# Patient Record
Sex: Male | Born: 1955
Health system: Southern US, Community
[De-identification: ages and names within clinical notes are randomized; demographics above are authoritative.]

## PROBLEM LIST (undated history)

## (undated) DIAGNOSIS — M1990 Unspecified osteoarthritis, unspecified site: Secondary | ICD-10-CM

## (undated) DIAGNOSIS — E785 Hyperlipidemia, unspecified: Secondary | ICD-10-CM

## (undated) DIAGNOSIS — I2510 Atherosclerotic heart disease of native coronary artery without angina pectoris: Secondary | ICD-10-CM

## (undated) DIAGNOSIS — F429 Obsessive-compulsive disorder, unspecified: Secondary | ICD-10-CM

## (undated) DIAGNOSIS — I10 Essential (primary) hypertension: Secondary | ICD-10-CM

## (undated) HISTORY — PX: TONSILLECTOMY: SUR1361

## (undated) HISTORY — PX: CARDIAC CATHETERIZATION: SHX172

## (undated) HISTORY — PX: ARTHROSCOPIC REPAIR ACL: SUR80

## (undated) HISTORY — DX: Atherosclerotic heart disease of native coronary artery without angina pectoris: I25.10

## (undated) HISTORY — DX: Obsessive-compulsive disorder, unspecified: F42.9

## (undated) HISTORY — PX: OTHER SURGICAL HISTORY: SHX169

## (undated) HISTORY — PX: EYE SURGERY: SHX253

## (undated) HISTORY — PX: HERNIA REPAIR: SHX51

## (undated) HISTORY — DX: Hyperlipidemia, unspecified: E78.5

## (undated) HISTORY — DX: Essential (primary) hypertension: I10

---

## 2002-09-30 ENCOUNTER — Ambulatory Visit (HOSPITAL_BASED_OUTPATIENT_CLINIC_OR_DEPARTMENT_OTHER): Admission: RE | Admit: 2002-09-30 | Discharge: 2002-09-30 | Payer: Self-pay | Admitting: *Deleted

## 2004-04-14 ENCOUNTER — Emergency Department (HOSPITAL_COMMUNITY): Admission: EM | Admit: 2004-04-14 | Discharge: 2004-04-15 | Payer: Self-pay | Admitting: Emergency Medicine

## 2005-07-10 ENCOUNTER — Ambulatory Visit (HOSPITAL_COMMUNITY): Admission: RE | Admit: 2005-07-10 | Discharge: 2005-07-10 | Payer: Self-pay | Admitting: Gastroenterology

## 2005-07-10 ENCOUNTER — Encounter (INDEPENDENT_AMBULATORY_CARE_PROVIDER_SITE_OTHER): Payer: Self-pay | Admitting: *Deleted

## 2007-12-05 ENCOUNTER — Ambulatory Visit (HOSPITAL_COMMUNITY): Admission: RE | Admit: 2007-12-05 | Discharge: 2007-12-05 | Payer: Self-pay | Admitting: Cardiology

## 2007-12-19 ENCOUNTER — Inpatient Hospital Stay (HOSPITAL_COMMUNITY): Admission: AD | Admit: 2007-12-19 | Discharge: 2007-12-20 | Payer: Self-pay | Admitting: *Deleted

## 2008-01-01 ENCOUNTER — Encounter: Admission: RE | Admit: 2008-01-01 | Discharge: 2008-01-01 | Payer: Self-pay | Admitting: *Deleted

## 2008-02-06 ENCOUNTER — Encounter (HOSPITAL_COMMUNITY): Admission: RE | Admit: 2008-02-06 | Discharge: 2008-05-06 | Payer: Self-pay | Admitting: *Deleted

## 2008-05-07 ENCOUNTER — Encounter (HOSPITAL_COMMUNITY): Admission: RE | Admit: 2008-05-07 | Discharge: 2008-06-01 | Payer: Self-pay | Admitting: *Deleted

## 2008-10-19 ENCOUNTER — Encounter: Admission: RE | Admit: 2008-10-19 | Discharge: 2008-10-19 | Payer: Self-pay | Admitting: Family Medicine

## 2008-11-27 ENCOUNTER — Ambulatory Visit (HOSPITAL_BASED_OUTPATIENT_CLINIC_OR_DEPARTMENT_OTHER): Admission: RE | Admit: 2008-11-27 | Discharge: 2008-11-27 | Payer: Self-pay | Admitting: Specialist

## 2010-06-02 ENCOUNTER — Ambulatory Visit: Payer: Self-pay | Admitting: Cardiology

## 2010-06-24 ENCOUNTER — Ambulatory Visit: Payer: Self-pay | Admitting: Cardiology

## 2010-09-12 LAB — POCT I-STAT 4, (NA,K, GLUC, HGB,HCT)
Glucose, Bld: 100 mg/dL — ABNORMAL HIGH (ref 70–99)
HCT: 47 % (ref 39.0–52.0)

## 2010-10-18 NOTE — Discharge Summary (Signed)
NAME:  Nathan Sampson, PAPADOPOULOS NO.:  [id number]   MEDICAL RECORD NO.:  [id number]          PATIENT TYPE:  INP   LOCATION:  6527                         FACILITY:  [facility]   PHYSICIAN:  Elmore Guise., M.D.DATE OF BIRTH:  May 07, 1956   DATE OF ADMISSION:  12/19/2007  DATE OF DISCHARGE:  12/20/2007                               DISCHARGE SUMMARY   DISCHARGE DIAGNOSES:  1. History of obstructive two-vessel coronary artery disease, status      post percutaneous coronary intervention of his mid left anterior      descending and distal right coronary artery.  2. Dyslipidemia.  3. Borderline hypertension.  4. Attention deficit disorder.   HISTORY OF PRESENT ILLNESS:  Nathan Sampson is a 55 year old white male who  presented for evaluation of some like atypical chest pain.  He underwent  stress test at an outside facility which showed a reversible  anteroseptal and anterior defect.  He continued to have off and on  symptoms despite being put on Toprol, aspirin with sublingual  nitroglycerin.  He was then referred for cardiac catheterization.  However, the patient wanted noninvasive study first.  Therefore, he was  referred for coronary CT.  This showed moderate obstruction in his mid  LAD with a moderate obstruction in his distal RCA.  He was then referred  for cath.   HOSPITAL COURSE:  The patient underwent cardiac catheterization on December 19, 2007, tolerated procedure well.  He was found to have two-vessel  disease with 60%-70% disease in his mid LAD as well as in his distal  RCA.  He underwent successful PCI with 2 Promus drug-eluting stents.  In  his postop course, he did have a small hematoma.  He did have Angio-Seal  device failure noted.  This resolved with direct compression.  He is now  being up and ambulatory with no significant problems.  His hemoglobin  has been stable.  Postprocedure hemoglobin of 14.2 and postprocedure day  #1 hemoglobin of 13.6.  Kidney function  showed a BUN and creatinine of  13 and 1.1 with potassium level of 4.3.  His EKG showed nonspecific  changes.  He will be discharged home to continue the following  medications:  1. Lipitor 40 mg daily.  2. Aciphex 20 mg daily.  3. Strattera 240 mg daily.  4. Advair  5. Toprol 50 mg daily.  6. Aspirin 325 mg daily.  7. Sublingual nitroglycerin as needed.  8. Plavix 75 mg daily.   Because of his hematoma, I have recommended no significant exertional  activities such as heavy lifting, prolonged walking or exercise for the  next 5 days.  He may then slowly resume his normal activities as  tolerated.  He is to call the office if he has any further problems,  otherwise, I plan to see him in the office in 2 weeks.  He may resume  back his normal work activities on Monday, December 30, 2007.      Elmore Guise., M.D.  Electronically Signed     TWK/MEDQ  D:  12/20/2007  T:  12/20/2007  Job:  [id number]

## 2010-10-18 NOTE — Op Note (Signed)
NAME:  Nathan Sampson, Nathan Sampson                 ACCOUNT NO.:  [id number]   MEDICAL RECORD NO.:  [id number]          PATIENT TYPE:  AMB   LOCATION:  [facility]                         FACILITY:  [facility]   PHYSICIAN:  Jene Every, M.D.    DATE OF BIRTH:  [birth year]   DATE OF PROCEDURE:  11/27/2008  DATE OF DISCHARGE:                               OPERATIVE REPORT   PREOPERATIVE DIAGNOSIS:  Medial meniscus tear, degenerative joint  disease, right knee.   POSTOPERATIVE DIAGNOSIS:  Medial meniscus tear, degenerative joint  disease, right knee.   PROCEDURES PERFORMED:  1. Right knee arthroscopy.  2. Partial medial meniscectomy.  3. Chondroplasty of the medial femoral condyle, tibial plateau,      patella.   BRIEF HISTORY:  A 55 year old, meniscus tear, refractory to conservative  treatment, confirmed by MRI.  Tender medial joint line, positive  McMurray, indicated for partial medial meniscectomy and debridement.   The risks and benefits discussed, including bleeding, infection, damage  to vascular structures, no change in symptoms, worsening symptoms, need  for repeat debridement, anesthetic complications, etc.   TECHNIQUE:  Patient in supine position.  After induction of adequate  anesthesia, 1 gram of Kefzol, the right lower extremity was prepped and  draped in the usual sterile fashion.  A lateral parapatellar portal and  superomedial parapatellar portal was fashioned with a #11 blade.  Ingress cannula atraumatically placed.  Irrigant was utilized to  insufflate the joint.  Under direct visualization, medial parapatellar  portal was fashioned with a #11 blade after localization with an 18-  gauge needle, sparing the medial meniscus.  There was a tear of the  posterior horn of the medial meniscus, complex.  Straight basket rongeur  was introduced and utilized to perform partial medial meniscectomy to a  stable base.  The inner third of the posterior third was removed.  Extensive cartilaginous  debris was noted and extensive grade 3 change of  the medial femoral condyle was noted.  A 3.5 Cuda shaver was introduced  and utilized to perform a light chondroplasty of the medial femoral  condyle and evacuate loose bodies.   ACL and PCL were unremarkable.  Mild fraying was noted.   Lateral compartment revealed essentially normal femoral condyle, tibial  plateau and meniscus, stable to probe palpation.  There was a loose  cartilaginous body floating in the medial compartment.  This was  evacuated.   Suprapatellar pouch:  Grade 3 changes to the patella, normal  patellofemoral tracking.  Chondroplasty was performed here as well.   Gutters were unremarkable.   Reexamined the medial compartment.  No further pathology amenable to  arthroscopic intervention.  The knee copiously lavaged, all  instrumentation was removed.  Portals were closed with 4-0 nylon simple  sutures and 0.25%  Marcaine with epinephrine was infiltrate in the joint, and the wound was  dressed sterilely.  Awoken without difficulty and transported to the  recovery room in satisfactory condition.   The patient tolerated the procedure well, no complications.      Jene Every, M.D.  Electronically Signed     JB/MEDQ  D:  11/27/2008  T:  11/27/2008  Job:  [id number]

## 2010-10-21 NOTE — Op Note (Signed)
NAME:  Nathan Sampson, Nathan Sampson                 ACCOUNT NO.:  [id number]   MEDICAL RECORD NO.:  [id number]          PATIENT TYPE:  AMB   LOCATION:  ENDO                         FACILITY:  [facility]   PHYSICIAN:  Anselmo Rod, M.D.  DATE OF BIRTH:  04-24-1956   DATE OF PROCEDURE:  07/10/2005  DATE OF DISCHARGE:                                 OPERATIVE REPORT   PROCEDURE PERFORMED:  Colonoscopy with snare polypectomy times one.   ENDOSCOPIST:  Jyothi. Loreta Ave, M.D.   INSTRUMENT USED:  Olympus video colonoscope.   INDICATIONS FOR PROCEDURE:  A 55 year old white male with a history of  rectal bleeding undergoing screening colonoscopy.  Rule out colonic polyps,  masses, etc.   PREPROCEDURE PREPARATION:  Informed consent was procured from the patient.  The patient was fasted for four hours prior to the procedure after being  prepped with OsmoPrep pills the night prior to and the morning of the  procedure.  The risks and benefits of the procedure including a 10% miss  rate for polyps or cancers was discussed with the patient as well.   PREPROCEDURE PHYSICAL:  The patient had stable vital signs.  Neck supple.  Chest clear to auscultation.  S1 and S2 regular.  Abdomen soft with normal  bowel sounds.   DESCRIPTION OF PROCEDURE:  The patient was placed in left lateral decubitus  position and sedated with 100 mcg of fentanyl and 10 mg of Versed in slow  incremental doses.  Once the patient was adequately sedated and maintained  on low flow oxygen and continuous cardiac monitoring, the Olympus video  colonoscope was advanced from the rectum to the cecum. The appendicular  orifice and ileocecal valve were clearly visualized and photographed. A  large pedunculated polyp was snared from 25 cm with a cut and coag  technique.  There was evidence of sigmoid diverticulosis.  The transverse  colon, right colon and cecum appeared normal.  The patient tolerated the  procedure well without complication.  Small  internal hemorrhoids were seen  on retroflexion.   IMPRESSION:  1.  Small nonbleeding internal hemorrhoids.  2.  Large pedunculated polyp removed from 25 cm by hot snare.  3.  Early sigmoid diverticulosis.  4.  Normal-appearing transverse colon, right colon and cecum.   RECOMMENDATIONS:  1.  Avoid all nonsteroidals including aspirin for the next four weeks.  2.  Outpatient followup as need arises in the future.  3.  Repeat colonoscopy depending on pathology results.  4.  A high fiber diet with liberal fluid intake has been encouraged.      Further recommendations will be made at follow-up.      Anselmo Rod, M.D.  Electronically Signed     JNM/MEDQ  D:  07/10/2005  T:  07/10/2005  Job:  [id number]   cc:   Teena Irani. Arlyce Dice, M.D.  Fax: [phone]

## 2010-11-22 ENCOUNTER — Other Ambulatory Visit: Payer: Self-pay | Admitting: *Deleted

## 2010-11-22 MED ORDER — RAMIPRIL 5 MG PO TABS: 5.0000 mg | ORAL_TABLET | Freq: Every day | ORAL | Status: DC

## 2010-11-22 MED ORDER — FOLIC ACID 1 MG PO TABS: 1.0000 mg | ORAL_TABLET | Freq: Every day | ORAL | Status: DC

## 2010-11-24 ENCOUNTER — Other Ambulatory Visit: Payer: Self-pay | Admitting: Cardiology

## 2010-11-24 MED ORDER — RAMIPRIL 5 MG PO TABS: 5.0000 mg | ORAL_TABLET | Freq: Every day | ORAL | Status: DC

## 2010-11-24 NOTE — Telephone Encounter (Signed)
CALL PATIENT REGARDING REFILLS (HE BELIEVES ALL MEDS) AND WHETHER OR NOT HE NEEDS ANY LABS TO CHECK CHOLESTEROL.

## 2010-11-24 NOTE — Telephone Encounter (Signed)
Called requesting refill on Ramipril. Sent to [facility]. Also made him an app. Was suppose to be seen  in June and didn't get recall.

## 2010-12-19 ENCOUNTER — Other Ambulatory Visit: Payer: Self-pay | Admitting: Cardiology

## 2010-12-19 MED ORDER — CARVEDILOL 12.5 MG PO TABS: 12.5000 mg | ORAL_TABLET | Freq: Two times a day (BID) | ORAL | Status: DC

## 2010-12-19 NOTE — Telephone Encounter (Signed)
Pt wanted refill of coreg please call

## 2010-12-19 NOTE — Telephone Encounter (Signed)
Called requesting refill on Carvedilol. Sent to [facility] in [place]

## 2010-12-27 ENCOUNTER — Encounter: Payer: Self-pay | Admitting: Cardiology

## 2010-12-28 ENCOUNTER — Encounter: Payer: Self-pay | Admitting: Cardiology

## 2010-12-28 ENCOUNTER — Ambulatory Visit (INDEPENDENT_AMBULATORY_CARE_PROVIDER_SITE_OTHER): Payer: BC Managed Care – PPO | Admitting: Cardiology

## 2010-12-28 VITALS — BP 132/80 | HR 60 | Ht 70.0 in | Wt 227.0 lb

## 2010-12-28 DIAGNOSIS — E785 Hyperlipidemia, unspecified: Secondary | ICD-10-CM | POA: Insufficient documentation

## 2010-12-28 DIAGNOSIS — I2510 Atherosclerotic heart disease of native coronary artery without angina pectoris: Secondary | ICD-10-CM

## 2010-12-28 DIAGNOSIS — I10 Essential (primary) hypertension: Secondary | ICD-10-CM

## 2010-12-28 NOTE — Patient Instructions (Signed)
I recommend you increase your Lipitor to 80 mg daily.  You may stop Plavix and continue ASA 325 mg daily.  Get regular aerobic exercise and lose weight.  I will see you again in 6 months.

## 2010-12-28 NOTE — Assessment & Plan Note (Signed)
He remains asymptomatic from a coronary standpoint. His last Cardiolite study in June of 2011 was normal. I think that he can safely stop his Plavix at this point and remain on aspirin daily. He needs to increase his aerobic activity and lose weight.

## 2010-12-28 NOTE — Progress Notes (Signed)
Nathan Sampson Date of Birth: 1955-10-29   History of Present Illness: Nathan Sampson is seen for followup today. He continues to complain of feeling tired and exhausted. He complains that his cognitive function has decreased. He's had severe erectile dysfunction for at least 7 months. At times he feels so tired he can't get out of bed. He has not been exercising regularly. He is considering starting on testosterone supplementation. He reports his blood pressure is usually low in the morning and then sometimes spikes in the afternoon or evening. He denies any chest pain or shortness of breath.  Current Outpatient Prescriptions on File Prior to Visit  Medication Sig Dispense Refill  . ALPRAZolam (XANAX) 0.5 MG tablet Take 0.5 mg by mouth at bedtime as needed.        Marland Kitchen aspirin 325 MG tablet Take 325 mg by mouth daily.        Marland Kitchen atomoxetine (STRATTERA) 80 MG capsule Take 80 mg by mouth daily.        Marland Kitchen atorvastatin (LIPITOR) 40 MG tablet Take 40 mg by mouth daily.        . carvedilol (COREG) 12.5 MG tablet Take 1 tablet (12.5 mg total) by mouth 2 (two) times daily.  60 tablet  5  . clopidogrel (PLAVIX) 75 MG tablet Take 75 mg by mouth daily.        . Coenzyme Q10 (EQL COQ10) 300 MG CAPS Take by mouth daily.        . fish oil-omega-3 fatty acids 1000 MG capsule Take 500 mg by mouth daily.        Marland Kitchen NIACIN PO Take 1,000 mg by mouth daily.        . nitroGLYCERIN (NITROSTAT) 0.4 MG SL tablet Place 0.4 mg under the tongue every 5 (five) minutes as needed.        . ramipril (ALTACE) 5 MG tablet Take 1 tablet (5 mg total) by mouth daily.  90 tablet  3    Allergies  Allergen Reactions  . Penicillins     Past Medical History  Diagnosis Date  . Coronary artery disease     Status post stenting of the mid LAD and distal right coronary with drug-eluting stents in July 2009.  He is Asymptomatic. He had a strss echo in September 2009 which was normal.  . Dyslipidemia   . Hypertension     Poorly Controlled.  Exacerbated by sedentary lifestyle, diet and weight gain, and stress.  . OCD (obsessive compulsive disorder)     Past Surgical History  Procedure Date  . Arthroscopic repair acl   . Cardiovascular stress test 11-29-2009    EF 57%    History  Smoking status  . Former Smoker  . Quit date: 06/05/2008  Smokeless tobacco  . Not on file    History  Alcohol Use     Family History  Problem Relation Age of Onset  . Diabetes Father     Review of Systems: As noted in history of present illness All other systems were reviewed and are negative.  Physical Exam: BP 132/80  Pulse 60  Ht 5\' 10"  (1.778 m)  Wt 227 lb (102.967 kg)  BMI 32.57 kg/m2 He is an obese white male in no acute distress. He is normocephalic, atraumatic. Pupils are round and reactive to light accommodation. Extraocular movements are full. Oropharynx is clear. Neck is supple no JVD, adenopathy, thyromegaly, or bruits. Lungs are clear. Cardiac exam reveals a regular rate and rhythm without  gallop, murmur, or click. Abdomen is soft and nontender. He has no edema. Pedal pulses are good. LABORATORY DATA: Dated December 13, 2010 his total cholesterol is 192, triglycerides 146, HDL 37, LDL 126, and non-HDL cholesterol 155. Chemistries and CBC are normal. ECG today is normal.  Assessment / Plan:

## 2010-12-28 NOTE — Assessment & Plan Note (Signed)
He is not at goal of an LDL less than or equal to 70 or non-HDL cholesterol less than or equal to 100. Recommended increasing his Lipitor to 80 mg per day. He were made on his niacin. We discussed appropriate diet and weight loss.

## 2010-12-28 NOTE — Assessment & Plan Note (Signed)
We will continue with his current antihypertensive therapy. Headache his blood pressure control will improve with better aerobic conditioning and weight loss.

## 2010-12-29 ENCOUNTER — Encounter: Payer: Self-pay | Admitting: Cardiology

## 2011-02-16 ENCOUNTER — Telehealth: Payer: Self-pay | Admitting: Cardiology

## 2011-02-16 NOTE — Telephone Encounter (Signed)
Pt states he was put on testerone and pt states he has dropped 25 lbs.  Pt states his blood pressure has dropped as well.  Pt states he checked it today and it was 100 / 60.  Please call pt back for medical advice.  Chart in box.

## 2011-02-17 NOTE — Telephone Encounter (Signed)
Had called yesterday to say he has started Testerone shots after his last visit here. Has lost about 25 lbs.from 225 lb to now 208 lbs. BP has dropped also. BP now 108/66 hr; 60; 90/60; states he feels fine, no lightness or dizziness. Taking Coreg 12.5 mg BID, Ramipril 5 mg daily. Per Dr. Swaziland will decrease Coreg to 6.25 mg BID. LM for him to decrease dose and to call if has questions.

## 2011-03-03 LAB — BASIC METABOLIC PANEL
CO2: 29
Chloride: 106
GFR calc Af Amer: >60
Sodium: 139

## 2011-03-03 LAB — CBC
HCT: 39
Hemoglobin: 13.6
MCHC: 34.8
MCV: 87.4
RBC: 4.47
RBC: 4.65
WBC: 7.7

## 2011-07-07 ENCOUNTER — Other Ambulatory Visit: Payer: Self-pay | Admitting: Cardiology

## 2011-08-23 ENCOUNTER — Ambulatory Visit: Payer: BC Managed Care – PPO | Admitting: Cardiology

## 2011-09-15 ENCOUNTER — Other Ambulatory Visit: Payer: Self-pay | Admitting: Otolaryngology

## 2011-09-21 ENCOUNTER — Encounter: Payer: Self-pay | Admitting: Cardiology

## 2011-09-21 ENCOUNTER — Ambulatory Visit (INDEPENDENT_AMBULATORY_CARE_PROVIDER_SITE_OTHER): Payer: BC Managed Care – PPO | Admitting: Cardiology

## 2011-09-21 VITALS — BP 122/76 | HR 58 | Ht 70.0 in | Wt 236.0 lb

## 2011-09-21 DIAGNOSIS — I10 Essential (primary) hypertension: Secondary | ICD-10-CM

## 2011-09-21 DIAGNOSIS — E785 Hyperlipidemia, unspecified: Secondary | ICD-10-CM

## 2011-09-21 DIAGNOSIS — I2510 Atherosclerotic heart disease of native coronary artery without angina pectoris: Secondary | ICD-10-CM

## 2011-09-21 MED ORDER — CARVEDILOL 12.5 MG PO TABS: 12.5000 mg | ORAL_TABLET | Freq: Two times a day (BID) | ORAL | Status: DC

## 2011-09-21 MED ORDER — RAMIPRIL 5 MG PO TABS: 5.0000 mg | ORAL_TABLET | Freq: Every day | ORAL | Status: DC

## 2011-09-21 MED ORDER — NITROGLYCERIN 0.4 MG SL SUBL: 0.4000 mg | SUBLINGUAL_TABLET | SUBLINGUAL | Status: DC | PRN

## 2011-09-21 NOTE — Progress Notes (Signed)
Nathan Sampson Date of Birth: January 13, 1956   History of Present Illness: Nathan Sampson is seen for followup today. He states he is doing very well. He reports that he started on testosterone therapy. He felt much better and was working out regularly. He lost down to 200 pounds. He then injured his rotator cuff and has since been inactive and has gained back another 36 pounds. He is scheduled for surgery on his rotator cuff on May 9. He denies any significant chest pain or shortness of breath. He's had no edema or palpitations.  Current Outpatient Prescriptions on File Prior to Visit  Medication Sig Dispense Refill  . ALPRAZolam (XANAX) 0.5 MG tablet Take 0.5 mg by mouth at bedtime as needed.        Marland Kitchen aspirin 325 MG tablet Take 325 mg by mouth daily.        Marland Kitchen atomoxetine (STRATTERA) 80 MG capsule Take 80 mg by mouth daily.        Marland Kitchen atorvastatin (LIPITOR) 40 MG tablet Take 40 mg by mouth daily.        . clopidogrel (PLAVIX) 75 MG tablet Take 75 mg by mouth daily.        . Coenzyme Q10 (EQL COQ10) 300 MG CAPS Take by mouth daily.        . fish oil-omega-3 fatty acids 1000 MG capsule Take 500 mg by mouth daily.        Marland Kitchen NIACIN PO Take 1,000 mg by mouth daily.        Marland Kitchen DISCONTD: carvedilol (COREG) 12.5 MG tablet TAKE ONE TABLET BY MOUTH TWICE DAILY  60 tablet  5  . DISCONTD: nitroGLYCERIN (NITROSTAT) 0.4 MG SL tablet Place 0.4 mg under the tongue every 5 (five) minutes as needed.        Marland Kitchen DISCONTD: ramipril (ALTACE) 5 MG tablet Take 1 tablet (5 mg total) by mouth daily.  90 tablet  3    Allergies  Allergen Reactions  . Penicillins     Past Medical History  Diagnosis Date  . Coronary artery disease     Status post stenting of the mid LAD and distal right coronary with drug-eluting stents in July 2009.  He is Asymptomatic. He had a strss echo in September 2009 which was normal.  . Dyslipidemia   . Hypertension     Poorly Controlled. Exacerbated by sedentary lifestyle, diet and weight gain, and  stress.  . OCD (obsessive compulsive disorder)     Past Surgical History  Procedure Date  . Arthroscopic repair acl   . Cardiovascular stress test 11-29-2009    EF 57%    History  Smoking status  . Former Smoker  . Quit date: 06/05/2008  Smokeless tobacco  . Not on file    History  Alcohol Use     Family History  Problem Relation Age of Onset  . Diabetes Father     Review of Systems: As noted in history of present illness All other systems were reviewed and are negative.  Physical Exam: BP 122/76  Pulse 58  Ht 5\' 10"  (1.778 m)  Wt 236 lb (107.049 kg)  BMI 33.86 kg/m2  SpO2 97% He is an obese white male in no acute distress. He is normocephalic, atraumatic. Pupils are round and reactive to light accommodation. Extraocular movements are full. Oropharynx is clear. Neck is supple no JVD, adenopathy, thyromegaly, or bruits. Lungs are clear. Cardiac exam reveals a regular rate and rhythm without gallop, murmur, or click.  Abdomen is soft and nontender. He has no edema. Pedal pulses are good. LABORATORY DATA:   Assessment / Plan:

## 2011-09-21 NOTE — Assessment & Plan Note (Addendum)
He is status post stenting of the mid LAD and distal right coronary with drug-eluting stents in July of 2009. He is no longer on Plavix. He will continue on aspirin. I would prefer that he stay on aspirin through his surgery to decrease his risk of subacute thrombosis. He is cleared for his rotator cuff surgery from a cardiac standpoint. His last nuclear stress test in June of 2011 was normal.

## 2011-09-21 NOTE — Assessment & Plan Note (Signed)
Blood pressure control is excellent on carvedilol and Altace. These prescriptions were refilled today.

## 2011-09-21 NOTE — Assessment & Plan Note (Signed)
We reviewed his lab work from last summer which indicated that his LDL and non-HDL cholesterol were not at goal. He states that they have been checked a couple months ago were much better. We will get a copy of these lab work. He is no longer on niacin. We will continue with statin therapy.

## 2011-09-21 NOTE — Patient Instructions (Signed)
Continue your current medication.  We will clear you for rotator cuff surgery.  I will see you again in 6 months.  We will get a copy of your lab work from Dr. Andrey Campanile.

## 2011-10-03 ENCOUNTER — Encounter (HOSPITAL_COMMUNITY): Payer: Self-pay | Admitting: Pharmacy Technician

## 2011-10-09 ENCOUNTER — Ambulatory Visit (HOSPITAL_COMMUNITY)
Admission: RE | Admit: 2011-10-09 | Discharge: 2011-10-09 | Disposition: A | Discharge status: Home / Self Care | Payer: BC Managed Care – PPO | Source: Ambulatory Visit | Attending: Specialist | Admitting: Specialist

## 2011-10-09 ENCOUNTER — Encounter (HOSPITAL_COMMUNITY)
Admission: RE | Admit: 2011-10-09 | Discharge: 2011-10-09 | Disposition: A | Discharge status: Home / Self Care | Payer: BC Managed Care – PPO | Source: Ambulatory Visit | Attending: Specialist | Admitting: Specialist

## 2011-10-09 ENCOUNTER — Encounter (HOSPITAL_COMMUNITY): Payer: Self-pay

## 2011-10-09 DIAGNOSIS — I10 Essential (primary) hypertension: Secondary | ICD-10-CM | POA: Insufficient documentation

## 2011-10-09 DIAGNOSIS — Z01812 Encounter for preprocedural laboratory examination: Secondary | ICD-10-CM | POA: Insufficient documentation

## 2011-10-09 DIAGNOSIS — Z01818 Encounter for other preprocedural examination: Secondary | ICD-10-CM | POA: Insufficient documentation

## 2011-10-09 DIAGNOSIS — I2510 Atherosclerotic heart disease of native coronary artery without angina pectoris: Secondary | ICD-10-CM | POA: Insufficient documentation

## 2011-10-09 LAB — BASIC METABOLIC PANEL
GFR calc Af Amer: 77 mL/min — ABNORMAL LOW (ref >90)
GFR calc non Af Amer: 67 mL/min — ABNORMAL LOW (ref >90)
Glucose, Bld: 97 mg/dL (ref 70–99)
Potassium: 4.2 mEq/L (ref 3.5–5.1)
Sodium: 139 mEq/L (ref 135–145)

## 2011-10-09 LAB — SURGICAL PCR SCREEN
MRSA, PCR: INVALID — AB
Staphylococcus aureus: INVALID — AB

## 2011-10-09 LAB — CBC
Hemoglobin: 15.9 g/dL (ref 13.0–17.0)
MCHC: 33.7 g/dL (ref 30.0–36.0)
RDW: 13 % (ref 11.5–15.5)
WBC: 9.3 10*3/uL (ref 4.0–10.5)

## 2011-10-09 NOTE — Patient Instructions (Signed)
YOUR SURGERY IS SCHEDULED ON: Thursday 5/9 AT 9:30 AM  REPORT TO [facility] AT: 7:30 AM      PHONE # FOR SHORT STAY IS [phone]  DO NOT EAT OR DRINK ANYTHING AFTER MIDNIGHT THE NIGHT BEFORE YOUR SURGERY.  YOU MAY BRUSH YOUR TEETH, RINSE OUT YOUR MOUTH--BUT NO WATER, NO FOOD, NO CHEWING GUM, NO MINTS, NO CANDIES, NO CHEWING TOBACCO.  PLEASE TAKE THE FOLLOWING MEDICATIONS THE AM OF YOUR SURGERY WITH A FEW SIPS OF WATER: CARVEDILOL (COREG)    IF YOU USE INHALERS--USE YOUR INHALERS THE AM OF YOUR SURGERY AND BRING INHALERS TO THE HOSPITAL -TAKE TO SURGERY.    IF YOU ARE DIABETIC:  DO NOT TAKE ANY DIABETIC MEDICATIONS THE AM OF YOUR SURGERY.  IF YOU TAKE INSULIN IN THE EVENINGS--PLEASE ONLY TAKE 1/2 NORMAL EVENING DOSE THE NIGHT BEFORE YOUR SURGERY.  NO INSULIN THE AM OF YOUR SURGERY.  IF YOU HAVE SLEEP APNEA AND USE CPAP OR BIPAP--PLEASE BRING THE MASK --NOT THE MACHINE-NOT THE TUBING   -JUST THE MASK. DO NOT BRING VALUABLES, MONEY, CREDIT CARDS.  CONTACT LENS, DENTURES / PARTIALS, GLASSES SHOULD NOT BE WORN TO SURGERY AND IN MOST CASES-HEARING AIDS WILL NEED TO BE REMOVED.  BRING YOUR GLASSES CASE, ANY EQUIPMENT NEEDED FOR YOUR CONTACT LENS. FOR PATIENTS ADMITTED TO THE HOSPITAL--CHECK OUT TIME THE DAY OF DISCHARGE IS 11:00 AM.  ALL INPATIENT ROOMS ARE PRIVATE - WITH BATHROOM, TELEPHONE, TELEVISION AND WIFI INTERNET. IF YOU ARE BEING DISCHARGED THE SAME DAY OF YOUR SURGERY--YOU CAN NOT DRIVE YOURSELF HOME--AND SHOULD NOT GO HOME ALONE BY TAXI OR BUS.  NO DRIVING OR OPERATING MACHINERY FOR 24 HOURS FOLLOWING ANESTHESIA / PAIN MEDICATIONS.                            SPECIAL INSTRUCTIONS:  CHLORHEXIDINE SOAP SHOWER (other brand names are Betasept and Hibiclens ) PLEASE SHOWER WITH CHLORHEXIDINE THE NIGHT BEFORE YOUR SURGERY AND THE AM OF YOUR SURGERY. DO NOT USE CHLORHEXIDINE ON YOUR FACE OR PRIVATE AREAS--YOU MAY USE YOUR NORMAL SOAP THOSE AREAS AND YOUR NORMAL SHAMPOO.    WOMEN SHOULD AVOID SHAVING UNDER ARMS AND SHAVING LEGS 48 HOURS BEFORE USING CHLORHEXIDINE TO AVOID SKIN IRRITATION.  DO NOT USE IF ALLERGIC TO CHLORHEXIDINE.  PLEASE READ OVER ANY  FACT SHEETS THAT YOU WERE GIVEN: MRSA INFORMATION,  INCENTIVE SPIROMETER INFORMATION.

## 2011-10-11 ENCOUNTER — Inpatient Hospital Stay (HOSPITAL_COMMUNITY): Admission: RE | Admit: 2011-10-11 | Payer: BC Managed Care – PPO | Source: Ambulatory Visit

## 2011-10-11 NOTE — Pre-Procedure Instructions (Signed)
CBC, BMET, CXR AND PCR WERE DONE AT [facility] 10/09/11 PREOP--PT HAD EKG REPORT FROM [facility] DONE 12/28/10--REPORT ON PT'S CHART AND IN EPIC AND OFFICE NOTES FROM DR. P. Swaziland 09/21/11 ON PT'S CHART AND IN EPIC.  PCR WAS INVALID-SENT OUT FOR NASAL CULTURE -RESULTS NOT IN EPIC AS OF THIS AM--CHART SENT TO SHORT STAY WITH NOTE TO LOOK FOR RESULTS-SURGERY IS TOMORROW 10/12/11

## 2011-10-12 ENCOUNTER — Encounter (HOSPITAL_COMMUNITY): Payer: Self-pay | Admitting: Anesthesiology

## 2011-10-12 ENCOUNTER — Encounter (HOSPITAL_COMMUNITY): Payer: Self-pay

## 2011-10-12 ENCOUNTER — Ambulatory Visit (HOSPITAL_COMMUNITY)
Admission: RE | Admit: 2011-10-12 | Discharge: 2011-10-12 | Disposition: A | Discharge status: Home / Self Care | Payer: BC Managed Care – PPO | Source: Ambulatory Visit | Attending: Specialist | Admitting: Specialist

## 2011-10-12 ENCOUNTER — Ambulatory Visit (HOSPITAL_COMMUNITY): Payer: BC Managed Care – PPO | Admitting: Anesthesiology

## 2011-10-12 ENCOUNTER — Encounter (HOSPITAL_COMMUNITY)
Admission: RE | Disposition: A | Discharge status: Home / Self Care | Payer: Self-pay | Source: Ambulatory Visit | Attending: Specialist

## 2011-10-12 DIAGNOSIS — X58XXXA Exposure to other specified factors, initial encounter: Secondary | ICD-10-CM | POA: Insufficient documentation

## 2011-10-12 DIAGNOSIS — I10 Essential (primary) hypertension: Secondary | ICD-10-CM | POA: Insufficient documentation

## 2011-10-12 DIAGNOSIS — Z79899 Other long term (current) drug therapy: Secondary | ICD-10-CM | POA: Insufficient documentation

## 2011-10-12 DIAGNOSIS — S43429A Sprain of unspecified rotator cuff capsule, initial encounter: Secondary | ICD-10-CM | POA: Insufficient documentation

## 2011-10-12 DIAGNOSIS — M25819 Other specified joint disorders, unspecified shoulder: Secondary | ICD-10-CM | POA: Insufficient documentation

## 2011-10-12 DIAGNOSIS — M24119 Other articular cartilage disorders, unspecified shoulder: Secondary | ICD-10-CM | POA: Insufficient documentation

## 2011-10-12 DIAGNOSIS — I2510 Atherosclerotic heart disease of native coronary artery without angina pectoris: Secondary | ICD-10-CM | POA: Insufficient documentation

## 2011-10-12 DIAGNOSIS — M75100 Unspecified rotator cuff tear or rupture of unspecified shoulder, not specified as traumatic: Secondary | ICD-10-CM

## 2011-10-12 DIAGNOSIS — E785 Hyperlipidemia, unspecified: Secondary | ICD-10-CM | POA: Insufficient documentation

## 2011-10-12 LAB — MRSA CULTURE

## 2011-10-12 SURGERY — ARTHROSCOPY, SHOULDER, WITH ROTATOR CUFF REPAIR
Anesthesia: General | Site: Shoulder | Laterality: Left | Wound class: Clean

## 2011-10-12 MED ORDER — EPINEPHRINE HCL 1 MG/ML IJ SOLN
INTRAMUSCULAR | Status: AC
  Filled 2011-10-12: qty 2

## 2011-10-12 MED ORDER — ONDANSETRON HCL 4 MG/2ML IJ SOLN: 4.0000 mg | Freq: Four times a day (QID) | INTRAMUSCULAR | Status: DC | PRN

## 2011-10-12 MED ORDER — CISATRACURIUM BESYLATE (PF) 10 MG/5ML IV SOLN
INTRAVENOUS | Status: DC | PRN
  Administered 2011-10-12: 10 mg via INTRAVENOUS

## 2011-10-12 MED ORDER — ETOMIDATE 2 MG/ML IV SOLN
INTRAVENOUS | Status: DC | PRN
  Administered 2011-10-12: 20 mg via INTRAVENOUS

## 2011-10-12 MED ORDER — LACTATED RINGERS IV SOLN
INTRAVENOUS | Status: DC
  Administered 2011-10-12 (×2): 1000 mL via INTRAVENOUS

## 2011-10-12 MED ORDER — HYDROCODONE-ACETAMINOPHEN 7.5-325 MG PO TABS: 1.0000 | ORAL_TABLET | ORAL | Status: DC | PRN

## 2011-10-12 MED ORDER — MIDAZOLAM HCL 5 MG/5ML IJ SOLN
INTRAMUSCULAR | Status: DC | PRN
  Administered 2011-10-12: 2 mg via INTRAVENOUS

## 2011-10-12 MED ORDER — METHOCARBAMOL 500 MG PO TABS: 500.0000 mg | ORAL_TABLET | Freq: Four times a day (QID) | ORAL | Status: DC | PRN

## 2011-10-12 MED ORDER — LACTATED RINGERS IV SOLN: INTRAVENOUS | Status: DC

## 2011-10-12 MED ORDER — SODIUM CHLORIDE 0.9 % IV SOLN: INTRAVENOUS | Status: DC

## 2011-10-12 MED ORDER — CIPROFLOXACIN IN D5W 400 MG/200ML IV SOLN
400.0000 mg | INTRAVENOUS | Status: AC
  Administered 2011-10-12: 400 mg via INTRAVENOUS

## 2011-10-12 MED ORDER — ACETAMINOPHEN 10 MG/ML IV SOLN
INTRAVENOUS | Status: DC | PRN
  Administered 2011-10-12: 1000 mg via INTRAVENOUS

## 2011-10-12 MED ORDER — ACETAMINOPHEN 10 MG/ML IV SOLN
INTRAVENOUS | Status: AC
  Filled 2011-10-12: qty 100

## 2011-10-12 MED ORDER — EPHEDRINE SULFATE 50 MG/ML IJ SOLN
INTRAMUSCULAR | Status: DC | PRN
  Administered 2011-10-12: 5 mg via INTRAVENOUS

## 2011-10-12 MED ORDER — FENTANYL CITRATE 0.05 MG/ML IJ SOLN
INTRAMUSCULAR | Status: DC | PRN
  Administered 2011-10-12: 100 ug via INTRAVENOUS

## 2011-10-12 MED ORDER — EPINEPHRINE HCL 1 MG/ML IJ SOLN
INTRAMUSCULAR | Status: DC | PRN
  Administered 2011-10-12: 2 mL

## 2011-10-12 MED ORDER — LACTATED RINGERS IR SOLN
Status: DC | PRN
  Administered 2011-10-12: 6000 mL

## 2011-10-12 MED ORDER — VANCOMYCIN HCL 1000 MG IV SOLR
1500.0000 mg | INTRAVENOUS | Status: AC
  Administered 2011-10-12: 1500 mg via INTRAVENOUS
  Filled 2011-10-12: qty 1500

## 2011-10-12 MED ORDER — METHOCARBAMOL 100 MG/ML IJ SOLN
500.0000 mg | Freq: Four times a day (QID) | INTRAVENOUS | Status: DC | PRN
  Filled 2011-10-12: qty 5

## 2011-10-12 MED ORDER — HYDROMORPHONE HCL PF 1 MG/ML IJ SOLN: 0.5000 mg | INTRAMUSCULAR | Status: DC | PRN

## 2011-10-12 MED ORDER — MENTHOL 3 MG MT LOZG
1.0000 | LOZENGE | OROMUCOSAL | Status: DC | PRN
  Filled 2011-10-12: qty 9

## 2011-10-12 MED ORDER — HYDROMORPHONE HCL PF 1 MG/ML IJ SOLN: 0.2500 mg | INTRAMUSCULAR | Status: DC | PRN

## 2011-10-12 MED ORDER — SODIUM CHLORIDE 0.9 % IR SOLN
Status: DC | PRN
  Administered 2011-10-12: 10:00:00

## 2011-10-12 MED ORDER — METOCLOPRAMIDE HCL 5 MG PO TABS
5.0000 mg | ORAL_TABLET | Freq: Three times a day (TID) | ORAL | Status: DC | PRN
  Filled 2011-10-12: qty 2

## 2011-10-12 MED ORDER — HYDROCODONE-ACETAMINOPHEN 5-325 MG PO TABS
ORAL_TABLET | ORAL | Status: AC
  Administered 2011-10-12: 2
  Filled 2011-10-12: qty 2

## 2011-10-12 MED ORDER — BUPIVACAINE-EPINEPHRINE (PF) 0.5% -1:200000 IJ SOLN
INTRAMUSCULAR | Status: AC
  Filled 2011-10-12: qty 10

## 2011-10-12 MED ORDER — CHLORHEXIDINE GLUCONATE 4 % EX LIQD
60.0000 mL | Freq: Once | CUTANEOUS | Status: DC
  Filled 2011-10-12: qty 60

## 2011-10-12 MED ORDER — DIPHENHYDRAMINE HCL 12.5 MG/5ML PO ELIX
12.5000 mg | ORAL_SOLUTION | ORAL | Status: DC | PRN
  Filled 2011-10-12: qty 10

## 2011-10-12 MED ORDER — ONDANSETRON HCL 4 MG/2ML IJ SOLN
INTRAMUSCULAR | Status: DC | PRN
  Administered 2011-10-12: 4 mg via INTRAVENOUS

## 2011-10-12 MED ORDER — VANCOMYCIN HCL IN DEXTROSE 1-5 GM/200ML-% IV SOLN
1000.0000 mg | Freq: Two times a day (BID) | INTRAVENOUS | Status: DC
  Filled 2011-10-12: qty 200

## 2011-10-12 MED ORDER — BUPIVACAINE-EPINEPHRINE 0.5% -1:200000 IJ SOLN
INTRAMUSCULAR | Status: DC | PRN
  Administered 2011-10-12: 19 mL

## 2011-10-12 MED ORDER — ONDANSETRON HCL 4 MG PO TABS
4.0000 mg | ORAL_TABLET | Freq: Four times a day (QID) | ORAL | Status: DC | PRN
  Filled 2011-10-12: qty 1

## 2011-10-12 MED ORDER — DOCUSATE SODIUM 100 MG PO CAPS
100.0000 mg | ORAL_CAPSULE | Freq: Two times a day (BID) | ORAL | Status: DC
  Filled 2011-10-12 (×3): qty 1

## 2011-10-12 MED ORDER — METOCLOPRAMIDE HCL 5 MG/ML IJ SOLN: 5.0000 mg | Freq: Three times a day (TID) | INTRAMUSCULAR | Status: DC | PRN

## 2011-10-12 MED ORDER — CIPROFLOXACIN IN D5W 400 MG/200ML IV SOLN
INTRAVENOUS | Status: AC
  Filled 2011-10-12: qty 200

## 2011-10-12 MED ORDER — PHENOL 1.4 % MT LIQD: 1.0000 | OROMUCOSAL | Status: DC | PRN

## 2011-10-12 SURGICAL SUPPLY — 64 items
APL SKNCLS STERI-STRIP NONHPOA (GAUZE/BANDAGES/DRESSINGS) ×1
BAG SPEC THK2 15X12 ZIP CLS (MISCELLANEOUS) ×1
BAG ZIPLOCK 12X15 (MISCELLANEOUS) ×2 IMPLANT
BENZOIN TINCTURE PRP APPL 2/3 (GAUZE/BANDAGES/DRESSINGS) ×2 IMPLANT
BLADE CUTTER GATOR 3.5 (BLADE) ×2 IMPLANT
BLADE OSCILLATING/SAGITTAL (BLADE) ×2
BLADE SURG SZ11 CARB STEEL (BLADE) ×2 IMPLANT
BLADE SW THK.38XMED LNG THN (BLADE) ×1 IMPLANT
BNDG ADH 5X4 AIR PERM ELC (GAUZE/BANDAGES/DRESSINGS) ×1
BNDG COHESIVE 4X5 WHT NS (GAUZE/BANDAGES/DRESSINGS) ×2 IMPLANT
BUR OVAL 4.0 (BURR) ×2 IMPLANT
BUR OVAL CARBIDE 4.0 (BURR) ×2 IMPLANT
CANNULA ACUFO 5X76 (CANNULA) ×3 IMPLANT
CHLORAPREP W/TINT 26ML (MISCELLANEOUS) IMPLANT
CLEANER TIP ELECTROSURG 2X2 (MISCELLANEOUS) ×2 IMPLANT
CLOTH BEACON ORANGE TIMEOUT ST (SAFETY) ×2 IMPLANT
DECANTER SPIKE VIAL GLASS SM (MISCELLANEOUS) ×2 IMPLANT
DRAPE ORTHO SPLIT 77X108 STRL (DRAPES) ×2
DRAPE POUCH INSTRU U-SHP 10X18 (DRAPES) ×2 IMPLANT
DRAPE SURG ORHT 6 SPLT 77X108 (DRAPES) ×1 IMPLANT
DRAPE U-SHAPE 47X51 STRL (DRAPES) ×2 IMPLANT
DRSG EMULSION OIL 3X3 NADH (GAUZE/BANDAGES/DRESSINGS) ×2 IMPLANT
DRSG PAD ABDOMINAL 8X10 ST (GAUZE/BANDAGES/DRESSINGS) ×1 IMPLANT
DURAPREP 26ML APPLICATOR (WOUND CARE) ×2 IMPLANT
ELECT NDL TIP 2.8 STRL (NEEDLE) ×1 IMPLANT
ELECT NEEDLE TIP 2.8 STRL (NEEDLE) ×2 IMPLANT
ELECT REM PT RETURN 9FT ADLT (ELECTROSURGICAL) ×2
ELECTRODE REM PT RTRN 9FT ADLT (ELECTROSURGICAL) ×1 IMPLANT
GLOVE BIO SURGEON STRL SZ 6.5 (GLOVE) ×2 IMPLANT
GLOVE BIOGEL PI IND STRL 8 (GLOVE) ×1 IMPLANT
GLOVE BIOGEL PI INDICATOR 8 (GLOVE) ×1
GLOVE ECLIPSE 6.5 STRL STRAW (GLOVE) ×2 IMPLANT
GLOVE INDICATOR 6.5 STRL GRN (GLOVE) ×2 IMPLANT
GLOVE SURG SS PI 8.0 STRL IVOR (GLOVE) ×4 IMPLANT
GOWN PREVENTION PLUS LG XLONG (DISPOSABLE) ×2 IMPLANT
GOWN PREVENTION PLUS XLARGE (GOWN DISPOSABLE) ×2 IMPLANT
GOWN STRL REIN XL XLG (GOWN DISPOSABLE) ×2 IMPLANT
KIT BASIN OR (CUSTOM PROCEDURE TRAY) ×2 IMPLANT
MANIFOLD NEPTUNE II (INSTRUMENTS) ×4 IMPLANT
NDL SPNL 18GX3.5 QUINCKE PK (NEEDLE) ×1 IMPLANT
NEEDLE SPNL 18GX3.5 QUINCKE PK (NEEDLE) ×2 IMPLANT
PACK SHOULDER CUSTOM OPM052 (CUSTOM PROCEDURE TRAY) ×2 IMPLANT
POSITIONER SURGICAL ARM (MISCELLANEOUS) ×2 IMPLANT
SET ARTHROSCOPY TUBING (MISCELLANEOUS) ×2
SET ARTHROSCOPY TUBING LN (MISCELLANEOUS) ×1 IMPLANT
SLING ARM IMMOBILIZER LRG (SOFTGOODS) ×2 IMPLANT
SLING ARM IMMOBILIZER MED (SOFTGOODS) ×1 IMPLANT
SLING ULTRA II S (ORTHOPEDIC SUPPLIES) ×2 IMPLANT
SPONGE GAUZE 4X4 12PLY (GAUZE/BANDAGES/DRESSINGS) ×1 IMPLANT
SPONGE LAP 4X18 X RAY DECT (DISPOSABLE) ×2 IMPLANT
STRAP CHIN BCCS-OSI (MISCELLANEOUS) ×2 IMPLANT
STRIP CLOSURE SKIN 1/2X4 (GAUZE/BANDAGES/DRESSINGS) ×2 IMPLANT
SUT BONE WAX W31G (SUTURE) ×2 IMPLANT
SUT ETHIBOND 0 (SUTURE) ×3 IMPLANT
SUT ETHIBOND 2 OS 4 DA (SUTURE) ×4 IMPLANT
SUT ETHILON 4 0 PS 2 18 (SUTURE) ×2 IMPLANT
SUT VIC AB 1 CT1 27 (SUTURE)
SUT VIC AB 1 CT1 27XBRD ANTBC (SUTURE) ×2 IMPLANT
SUT VIC AB 1-0 CT2 27 (SUTURE) ×4 IMPLANT
SUT VIC AB 2-0 CT2 27 (SUTURE) ×1 IMPLANT
SUT VICRYL 0 UR6 27IN ABS (SUTURE) ×3 IMPLANT
SUT VICRYL 0-0 OS 2 NEEDLE (SUTURE) ×2 IMPLANT
TAPE CLOTH SURG 6X10 WHT LF (GAUZE/BANDAGES/DRESSINGS) ×1 IMPLANT
TUBING CONNECTING 10 (TUBING) ×2 IMPLANT

## 2011-10-12 NOTE — Anesthesia Postprocedure Evaluation (Signed)
  Anesthesia Post-op Note  [name]  Procedure(s) Performed: Procedure(s) (LRB): SHOULDER ARTHROSCOPY WITH ROTATOR CUFF REPAIR (Left)  Patient Location: PACU  Anesthesia Type: General  Level of Consciousness: awake and alert   Airway and Oxygen Therapy: Patient Spontanous Breathing  Post-op Pain: mild  Post-op Assessment: Post-op Vital signs reviewed, Patient's Cardiovascular Status Stable, Respiratory Function Stable, Patent Airway and No signs of Nausea or vomiting  Post-op Vital Signs: stable  Complications: No apparent anesthesia complications

## 2011-10-12 NOTE — Anesthesia Preprocedure Evaluation (Addendum)
Anesthesia Evaluation  Patient identified by MRN, date of birth, ID band Patient awake    Reviewed: Allergy & Precautions, H&P , NPO status , Patient's Chart, lab work & pertinent test results, reviewed documented beta blocker date and time   Airway Mallampati: II TM Distance: >3 FB Neck ROM: full    Dental No notable dental hx. (+) Teeth Intact and Dental Advisory Given   Pulmonary neg pulmonary ROS,  Sleep apnea surgery breath sounds clear to auscultation  Pulmonary exam normal       Cardiovascular Exercise Tolerance: Good hypertension, Pt. on home beta blockers + CAD and + Cardiac Stents negative cardio ROS  Rhythm:regular Rate:Normal  Stents mid LAD and distal RCA - drug eluting 7/09.  No CP or problems.  Normal nuclear stress 9/09.   Neuro/Psych negative neurological ROS  negative psych ROS   GI/Hepatic negative GI ROS, Neg liver ROS,   Endo/Other  negative endocrine ROS  Renal/GU negative Renal ROS  negative genitourinary   Musculoskeletal   Abdominal   Peds  Hematology negative hematology ROS (+)   Anesthesia Other Findings   Reproductive/Obstetrics negative OB ROS                          Anesthesia Physical Anesthesia Plan  ASA: III  Anesthesia Plan: General   Post-op Pain Management:    Induction: Intravenous  Airway Management Planned: Oral ETT  Additional Equipment:   Intra-op Plan:   Post-operative Plan: Extubation in OR  Informed Consent: I have reviewed the patients History and Physical, chart, labs and discussed the procedure including the risks, benefits and alternatives for the proposed anesthesia with the patient or authorized representative who has indicated his/her understanding and acceptance.   Dental Advisory Given  Plan Discussed with: CRNA and Surgeon  Anesthesia Plan Comments:         Anesthesia Quick Evaluation

## 2011-10-12 NOTE — H&P (Signed)
Nathan Sampson is an 56 y.o. male.   Chief Complaint: left RCT HPI: RCT left shoulder refractory  Past Medical History  Diagnosis Date  . Coronary artery disease     Status post stenting of the mid LAD and distal right coronary with drug-eluting stents in July 2009.  He is Asymptomatic. He had a strss echo in September 2009 which was normal.  . Dyslipidemia   . Hypertension     Poorly Controlled. Exacerbated by sedentary lifestyle, diet and weight gain, and stress.  . OCD (obsessive compulsive disorder)     Past Surgical History  Procedure Date  . Arthroscopic repair acl   . Cardiovascular stress test 11-29-2009    EF 57%  . Hernia repair     AGE 51 - HERNIA REPAIR  . Surgery for sleep apnea     ABOUT 10 YRS AGO-? PALATOPLASTY AND T/A-SURGERY WAS DONE IN [place]    Family History  Problem Relation Age of Onset  . Diabetes Father    Social History:  reports that he quit smoking about 3 years ago. He does not have any smokeless tobacco history on file. His alcohol and drug histories not on file.  Allergies:  Allergies  Allergen Reactions  . Penicillins Anaphylaxis    Medications Prior to Admission  Medication Sig Dispense Refill  . ALPRAZolam (XANAX) 0.5 MG tablet Take 0.5 mg by mouth at bedtime as needed. sleep      . aspirin 325 MG tablet Take 325 mg by mouth at bedtime.       Marland Kitchen atomoxetine (STRATTERA) 80 MG capsule Take 80 mg by mouth daily before breakfast.       . atorvastatin (LIPITOR) 80 MG tablet Take 80 mg by mouth at bedtime.      . carvedilol (COREG) 12.5 MG tablet Take 1 tablet (12.5 mg total) by mouth 2 (two) times daily with a meal.  180 tablet  3  . Coenzyme Q10 (EQL COQ10) 300 MG CAPS Take 300 mg by mouth daily.       . nitroGLYCERIN (NITROSTAT) 0.4 MG SL tablet Place 0.4 mg under the tongue every 5 (five) minutes as needed. Chest pain       . ramipril (ALTACE) 5 MG tablet Take 5 mg by mouth daily before breakfast.      . testosterone cypionate  (DEPOTESTOTERONE CYPIONATE) 100 MG/ML injection Inject 200 mg into the muscle every 14 (fourteen) days. For IM use only        No results found for this or any previous visit (from the past 48 hour(s)). No results found.  Review of Systems  Musculoskeletal: Positive for joint pain.  All other systems reviewed and are negative.    Blood pressure 120/78, pulse 62, temperature 97.2 F (36.2 C), temperature source Oral, resp. rate 18, SpO2 96.00%. Physical Exam  Vitals reviewed. Constitutional: He appears well-developed.  HENT:  Head: Normocephalic.  Eyes: Pupils are equal, round, and reactive to light.  Neck: Normal range of motion.  Cardiovascular: Normal rate.   Respiratory: Effort normal.  GI: Soft.  Musculoskeletal:       Positive pain with ROM left shoulder.  Neurological: He is alert.  Skin: Skin is warm.  Psychiatric: He has a normal mood and affect.     Assessment/Plan Left RCT. Plan RCR arthroscopic assist. . Risks discussed.  Rigel Filsinger C 10/12/2011, 7:31 AM

## 2011-10-12 NOTE — Brief Op Note (Signed)
10/12/2011  10:52 AM  PATIENT:  [name]  56 y.o. male  PRE-OPERATIVE DIAGNOSIS:  Left Shoulder Rotator Cuff Tear  POST-OPERATIVE DIAGNOSIS:  Left Shoulder Rotator Cuff Tear  PROCEDURE:  Procedure(s) (LRB): SHOULDER ARTHROSCOPY WITH ROTATOR CUFF REPAIR (Left)  SURGEON:  Surgeon(s) and Role:    * Javier Docker, MD - Primary  PHYSICIAN ASSISTANT:   ASSISTANTS: strader   ANESTHESIA:   general  EBL:     BLOOD ADMINISTERED:none  DRAINS: none   LOCAL MEDICATIONS USED:  MARCAINE     SPECIMEN:  No Specimen  DISPOSITION OF SPECIMEN:  N/A  COUNTS:  YES  TOURNIQUET:  * No tourniquets in log *  DICTATION: .Other Dictation: Dictation Number [id number]  PLAN OF CARE: Discharge to home after PACU  PATIENT DISPOSITION:  PACU - hemodynamically stable.   Delay start of Pharmacological VTE agent (>24hrs) due to surgical blood loss or risk of bleeding: no

## 2011-10-12 NOTE — Discharge Instructions (Signed)
Shoulder Arthroscopy Because the shoulder is one of the most mobile joints, it is more prone to injury. It is a very shallow ball and socket joint located between the large bone in your upper arm (humerus) and the shoulder blade (scapula). Arthroscopy is a valuable test for evaluating and treating injuries involving the shoulder joint. Arthroscopy is a surgical technique which uses small incisions (cuts by the surgeon) to insert a small telescope like instrument (arthroscope) and other tools into the shoulder. This allows the surgeon to look directly at the problem. When the arthroscope is in the joint, fluid is used to expand the joint space. This allows the surgeon to examine it more easily. The arthroscope then beams light into the joint and sends an image to a TV screen. As your surgeon examines your shoulder, he or she can also repair a number of problems found at the same time. Sometimes the procedure may change to an open surgery. This would happen if the problems are severe enough that they cannot be corrected with just arthroscopy. This is usually a very safe surgery. Rare complications include damage to nerves or blood vessels, excess bleeding, blood clots, infection, and rarely instrument failure. This is most often performed as a same day surgery. This means you will not have to stay in the hospital overnight. Recovery from this surgery is also much faster than having an open procedure. LET YOUR CAREGIVER KNOW ABOUT:  Allergies.   Medications taken including herbs, eye drops, over the counter medications, and creams.   Use of steroids (by mouth or creams).   Previous problems with anesthetics or novocaine.   Possibility of pregnancy, if this applies.   History of blood clots (thrombophlebitis).   History of bleeding or blood problems.   Previous surgery.   Other health problems.   Family history of anesthetic problems.  BEFORE THE PROCEDURE   Stop all anti-inflammatory  medications at least one week before surgery unless instructed otherwise. Tell your surgeon if you have been taking cortisone or other steroids.   Do not eat or drink after midnight or as instructed. Take medications as directed by your caregiver. You may have lab tests the morning of surgery.   You should be present 60 minutes prior to your procedure or as directed.  PROCEDURE  You may have general (go to sleep) or local (numb the area) anesthetic. Your surgeon will discuss this with you. During the procedure as discussed above, your surgeon may find a variety of problems which he or she can improve or correct using small instruments. When the procedure is finished the tiny incisions will be closed with stitches or tape. AFTER YOUR PROCEDURE  After surgery you will be taken to the recovery area. A nurse will watch and check your progress. Once you are awake, stable, and taking fluids well, barring other problems you will be allowed to go home.   Once home, apply an ice pack to your operative site for twenty minutes, three to four times per day, for two to three days. This may help with discomfort and keep the swelling down.   Use a sling and medications if prescribed or as instructed.   Unless your caregiver advises otherwise, move your arm and shoulder gently and frequently following the procedure. This can help prevent stiffness and swelling.  REHABILITATION  Almost as important as your surgery is your rehabilitation. If physical therapy and exercises are prescribed by your surgeon, follow them diligently. Once comfortable and on your way  to full use, do muscle strengthening exercises as instructed.   Only take over-the-counter or prescription medicines for pain, discomfort, or fever as directed by your caregiver.  SEEK IMMEDIATE MEDICAL CARE IF:   There is redness, swelling, or increasing pain in the wound or joint.   You notice purulent (colored- pus-like) drainage coming from the  wound.   An unexplained oral temperature above 102 F (38.9 C) develops.   You notice a foul smell coming from the wound or dressing.   There is a breaking open of the wound. The edges do not stay together after sutures or tape has been removed.   Persistent bleeding from the small incision.  Document Released: 05/19/2000 Document Revised: 05/11/2011 Document Reviewed: 09/07/2008 [facility] Patient Information 2012 [place], [place].

## 2011-10-12 NOTE — Transfer of Care (Signed)
Immediate Anesthesia Transfer of Care Note  [name]  Procedure(s) Performed: Procedure(s) (LRB): SHOULDER ARTHROSCOPY WITH ROTATOR CUFF REPAIR (Left)  Patient Location: PACU  Anesthesia Type: General  Level of Consciousness: awake, alert  and patient cooperative  Airway & Oxygen Therapy: Patient Spontanous Breathing and Patient connected to face mask oxygen  Post-op Assessment: Report given to PACU RN and Post -op Vital signs reviewed and stable  Post vital signs: Reviewed and stable  Complications: No apparent anesthesia complications

## 2011-10-13 NOTE — Discharge Summary (Signed)
Patient ID: Nathan Sampson MRN: [id number] DOB/AGE: 06-11-1955 56 y.o.  Admit date: 10/12/2011 Discharge date: 10/13/2011  Admission Diagnoses:  Left rotator cuff tear Past Medical History  Diagnosis Date  . Coronary artery disease     Status post stenting of the mid LAD and distal right coronary with drug-eluting stents in July 2009.  He is Asymptomatic. He had a strss echo in September 2009 which was normal.  . Dyslipidemia   . Hypertension     Poorly Controlled. Exacerbated by sedentary lifestyle, diet and weight gain, and stress.  . OCD (obsessive compulsive disorder)    Discharge Diagnoses:  Same   Surgeries: Procedure(s): SHOULDER ARTHROSCOPY WITH ROTATOR CUFF REPAIR on 10/12/2011   Consultants:    Discharged Condition: Improved  Hospital Course: Nathan Sampson is an 56 y.o. male who was admitted 10/12/2011 for operative treatment of rotator cuff tear. Patient has severe unremitting pain that affects sleep, daily activities, and work/hobbies. After pre-op clearance the patient was taken to the operating room on 10/12/2011 and underwent  Procedure(s): SHOULDER ARTHROSCOPY WITH ROTATOR CUFF REPAIR.    Patient was given perioperative antibiotics: Anti-infectives     Start     Dose/Rate Route Frequency Ordered Stop   10/12/11 2200   vancomycin (VANCOCIN) IVPB 1000 mg/200 mL premix  Status:  Discontinued        1,000 mg 200 mL/hr over 60 Minutes Intravenous Every 12 hours 10/12/11 1305 10/12/11 1817   10/12/11 1010   polymyxin B 500,000 Units, bacitracin 50,000 Units in sodium chloride irrigation 0.9 % 500 mL irrigation  Status:  Discontinued          As needed 10/12/11 1010 10/12/11 1103   10/12/11 0800   vancomycin (VANCOCIN) 1,500 mg in sodium chloride 0.9 % 500 mL IVPB        1,500 mg 250 mL/hr over 120 Minutes Intravenous To Surgery 10/12/11 0740 10/12/11 1037   10/12/11 0740   ciprofloxacin (CIPRO) IVPB 400 mg        400 mg 200 mL/hr over 60 Minutes Intravenous 120 min  pre-op 10/12/11 0740 10/12/11 4098           Patient was given sequential compression devices, early ambulation, and chemoprophylaxis to prevent DVT.  Patient benefited maximally from hospital stay and there were no complications.    Recent vital signs: Patient Vitals for the past 24 hrs:  BP Temp Pulse Resp SpO2  10/12/11 1245 124/86 mmHg 97.8 F (36.6 C) 58  14  96 %  10/12/11 1230 119/71 mmHg - 54  9  99 %  10/12/11 1215 124/81 mmHg 97.7 F (36.5 C) 58  15  99 %  10/12/11 1200 116/79 mmHg - 59  18  99 %  10/12/11 1145 128/73 mmHg - 60  13  98 %  10/12/11 1130 142/71 mmHg 96.9 F (36.1 C) 63  18  100 %  10/12/11 1115 132/81 mmHg 97.5 F (36.4 C) 74  14  99 %     Recent laboratory studies: No results found for this basename: WBC:2,HGB:2,HCT:2,PLT:2,NA:2,K:2,CL:2,CO2:2,BUN:2,CREATININE:2,GLUCOSE:2,PT:2,INR:2,CALCIUM,2: in the last 72 hours   Discharge Medications:   Medication List  As of 10/13/2011 10:29 AM   TAKE these medications         ALPRAZolam 0.5 MG tablet   Commonly known as: XANAX   Take 0.5 mg by mouth at bedtime as needed. sleep      aspirin 325 MG tablet   Take 325 mg by mouth at bedtime.  atomoxetine 80 MG capsule   Commonly known as: STRATTERA   Take 80 mg by mouth daily before breakfast.      atorvastatin 80 MG tablet   Commonly known as: LIPITOR   Take 80 mg by mouth at bedtime.      carvedilol 12.5 MG tablet   Commonly known as: COREG   Take 1 tablet (12.5 mg total) by mouth 2 (two) times daily with a meal.      EQL COQ10 300 MG Caps   Generic drug: Coenzyme Q10   Take 300 mg by mouth daily.      nitroGLYCERIN 0.4 MG SL tablet   Commonly known as: NITROSTAT   Place 0.4 mg under the tongue every 5 (five) minutes as needed. Chest pain        ramipril 5 MG tablet   Commonly known as: ALTACE   Take 5 mg by mouth daily before breakfast.      testosterone cypionate 100 MG/ML injection   Commonly known as: DEPOTESTOTERONE CYPIONATE    Inject 200 mg into the muscle every 14 (fourteen) days. For IM use only            Diagnostic Studies: Dg Chest 2 View  10/09/2011  *RADIOLOGY REPORT*  Clinical Data: 56 year old male preoperative study for left shoulder surgery.  Hypertension and coronary artery disease.  CHEST - 2 VIEW  Comparison: 06/05/2007.  Findings: Stable lung volumes.  Cardiac size and mediastinal contours are within normal limits.  Visualized tracheal air column is within normal limits.  Mild increased interstitial markings are stable.  No pneumothorax, pulmonary edema, pleural effusion or confluent pulmonary opacity.  Stable wedging of lower thoracic vertebral bodies. No acute osseous abnormality identified.  IMPRESSION: No acute cardiopulmonary abnormality.  Original Report Authenticated By: Harley Hallmark, M.D.    Disposition: 01-Home or Self Care  Discharge Orders    Future Orders Please Complete By Expires   Diet - low sodium heart healthy      Call MD / Call 911      Comments:   If you experience chest pain or shortness of breath, CALL 911 and be transported to the hospital emergency room.  If you develope a fever above 101 F, pus (white drainage) or increased drainage or redness at the wound, or calf pain, call your surgeon's office.   Constipation Prevention      Comments:   Drink plenty of fluids.  Prune juice may be helpful.  You may use a stool softener, such as Colace (over the counter) 100 mg twice a day.  Use MiraLax (over the counter) for constipation as needed.   Increase activity slowly as tolerated      Weight Bearing as taught in Physical Therapy      Comments:   Use a walker or crutches as instructed.   Discharge instructions      Comments:   Use sling at all times except when exercising OK to move hand, wrist, and elbow Keep arm elevated on pillow when out of sling Change dressing daily Ok to shower in 72 hours No lifting No driving 2-4 weeks Pendulum exercises as reviewed with PT        Follow-up Information    Follow up with BEANE,JEFFREY C, MD in 14 days.   Contact information:   [facility] [address], [address] [place] [address] [phone]           Signed: Liam Graham. 10/13/2011, 10:29 AM

## 2011-10-13 NOTE — Op Note (Signed)
NAMETAITEN, BRAWN NO.:  [id number]  MEDICAL RECORD NO.:  [id number]  LOCATION:  [facility]                         FACILITY:  [facility]  PHYSICIAN:  Jene Every, M.D.    DATE OF BIRTH:  01-11-1956  DATE OF PROCEDURE:  10/12/2011 DATE OF DISCHARGE:  10/12/2011                              OPERATIVE REPORT   PREOPERATIVE DIAGNOSES: 1. Extensive labral tear of the left shoulder. 2. Rotator cuff tear. 3. Impingement syndrome.  POSTOP: 1. Extensive labral tear of the left shoulder. 2. Rotator cuff tear. 3. Impingement syndrome.  PROCEDURE PERFORMED: 1. Exam under anesthesia. 2. Left shoulder arthroscopy with debridement of the extent to the     anterior, superior and posterior labral tear, and the glenohumeral     joint. 3. Subacromial decompression, acromioplasty, CA ligament resection,     bursectomy. 4. Mini open rotator cuff repair, side-to-side supraspinatus to     supraspinatus.  ANESTHESIA:  General.  ASSISTANT:  Roma Schanz, P.A.  HISTORY:  This 56 year old male fell on his shoulder 6 months ago, felt a pop.  An MRI indicated a rotator cuff tear, full thickness, supraspinatus, extensive labral tear, weakness in external rotation associated with pain, catching of the shoulder.  He was indicated for arthroscopic debridement.  We removed the labrum and repaired the rotator cuff.  Risks and benefits were discussed including bleeding, infection, damage to vascular structures, no change in symptoms, worsening symptoms, need for repeat debridement, DVT, PE, anesthetic complications etc.  TECHNIQUE:  With the patient in a supine beach-chair position after induction of adequate general anesthesia, 400 mg of Cipro and 1 g of vancomycin due to a history of MRSA exposure, the left shoulder was examined under anesthesia, he had full range.  A surgical marker was utilized to delineate the acromion, AC joint, and coracoid.  A standard posterior lateral  incision was made through the skin only  with a #11 blade.  With the arm in 70-30 position and gentle traction applied, we advanced the arthroscopic cannula into the glenohumeral joint in line with the coracoid penetrating it atraumatically.  Irrigant was then utilized to insufflate the joint.  65 mmHg was utilized.  Noted medially was extensive labral tearing, cartilaginous debris in the glenohumeral joint and some minor grade 4 changes in the glenohumeral joint.  Under direct visualization, an #18-gauge needle was utilized to localize the anterior portal, penetrating the capsule just beneath the biceps tendon between the coracoid and the anterolateral aspect of the acromion.  A small incision was made anteriorly, we advanced the cannula colinear with the needle.  We then introduced a shaver 3-5 into the glenohumeral joint, debriding extensively the anterior, superior and posterior labrum and the glenohumeral debrided.  We inspected the subscap, it was unremarkable.  The biceps tendon other than its labral attachment appeared to be intact and within its groove and subluxating.  There was no full attachment of the labrum, extensive tearing was noted.  This was debrided back to a stable base.  The rotator cuff from beneath had a full-thickness tear in the supraspinatus, more in the midportion of the tendon rather than its insertion.  After copious lavage and  full inspection we then redirected the arthroscopic camera in the subacromial space, translated the anterior portal to the anterolateral portal with a #11 blade, localized the incision, triangulating the subacromial space and insufflating with 65 mmHg.  Noted under direct visualization was hypertrophic bursa, which was excised with a 3-5 shaver performing a full bursectomy and release of the CA ligament.  A full-thickness tear was identified.  We then converted to a mini open rotator cuff repair, removed all arthroscopic equipment and  made a small 2 cm incision over the anterolateral aspect of the acromion between the anterolateral and medial heads of the deltoid.  The subcutaneous tissue was dissected, electrocautery used to achieve hemostasis.  The raphe between the anterolateral heads was identified, infiltrated with 0.25% Marcaine with epinephrine and divided in line with the skin incision.  We used an AO elevator, subperiosteally elevated the anterolateral and anteromedial aspect of this attachment from the acromion preserving the attachment. The remainder of the CA ligament was attached and excised.  The 3 mm Kerrison was utilized to remove the anterolateral aspect of the acromion.  I digitally lysed further adhesions in the sub-bursal space. There was a longitudinal split tear in the supraspinatus extending from near the glenohumeral joint to within a cm of its footprint was noted and some devascularization of the posterior leaflet.  We therefore debrided its edges to good bleeding tissue and then repaired it side-to- side with #1 Ethibond interrupted figure-of-eight sutures with excellent closure.  No further pathology was noted within the rotator cuff.  It had good subacromial space.  Therefore copiously lavaged the wound and repaired the raphe with #1 Vicryl interrupted figure of 8 sutures, subcu with 2-0 Vicryl simple sutures, skin with subcuticular Prolene, the wound reinforced with Steri-Strips.  Sterile dressing was applied. Arthroscopic portals were closed with 4-0 nylon sutures.  The patient was placed on an abduction pillow, extubated without difficulty, and transported to the recovery room in satisfactory condition.  The patient tolerated the procedure well.  There was no complication.     Jene Every, M.D.     Cordelia Pen  D:  10/12/2011  T:  10/13/2011  Job:  [id number]

## 2012-01-31 ENCOUNTER — Telehealth: Payer: Self-pay | Admitting: Cardiology

## 2012-01-31 DIAGNOSIS — I2510 Atherosclerotic heart disease of native coronary artery without angina pectoris: Secondary | ICD-10-CM

## 2012-01-31 NOTE — Telephone Encounter (Signed)
Spoke to patient was told spoke to Dr.Jordan he advised okay to have a stress myoview and follow up with him after.

## 2012-01-31 NOTE — Telephone Encounter (Signed)
New problem:   Wants a strest test before 11/1.

## 2012-03-18 ENCOUNTER — Ambulatory Visit (HOSPITAL_COMMUNITY): Payer: BC Managed Care – PPO | Attending: Cardiology | Admitting: Radiology

## 2012-03-18 VITALS — BP 129/85 | HR 85 | Ht 70.0 in | Wt 225.0 lb

## 2012-03-18 DIAGNOSIS — I2510 Atherosclerotic heart disease of native coronary artery without angina pectoris: Secondary | ICD-10-CM

## 2012-03-18 DIAGNOSIS — R55 Syncope and collapse: Secondary | ICD-10-CM | POA: Insufficient documentation

## 2012-03-18 DIAGNOSIS — I10 Essential (primary) hypertension: Secondary | ICD-10-CM | POA: Insufficient documentation

## 2012-03-18 DIAGNOSIS — Z8249 Family history of ischemic heart disease and other diseases of the circulatory system: Secondary | ICD-10-CM | POA: Insufficient documentation

## 2012-03-18 MED ORDER — TECHNETIUM TC 99M SESTAMIBI GENERIC - CARDIOLITE
10.0000 | Freq: Once | INTRAVENOUS | Status: AC | PRN
  Administered 2012-03-18: 10 via INTRAVENOUS

## 2012-03-18 MED ORDER — TECHNETIUM TC 99M SESTAMIBI GENERIC - CARDIOLITE
30.0000 | Freq: Once | INTRAVENOUS | Status: AC | PRN
  Administered 2012-03-18: 30 via INTRAVENOUS

## 2012-03-18 NOTE — Progress Notes (Signed)
[facility] SITE 3 NUCLEAR MED [address] [id number] [place] [address] [phone]  Cardiology Nuclear Med Study  Nathan MENNENGA is a 56 y.o. male     MRN : [id number]     DOB: 1955/09/03  Procedure Date: 03/18/2012  Nuclear Med Background Indication for Stress Test:  Evaluation for Ischemia and Stent Patency History:  7/09 Stents-LAD/RCA, EF=60%; 9/09 Stress  Echo:Normal; '11 UYQ:IHKVQQ, EF=57% Cardiac Risk Factors: Family History - CAD, History of Smoking, Hypertension, Lipids and Obesity  Symptoms:  Light-Headedness, Near Syncope and Chest "Ache", Relieved with Burping   Nuclear Pre-Procedure Caffeine/Decaff Intake:  None NPO After: 9:00pm   Lungs:  Clear. O2 Sat: 98% on room air. IV 0.9% NS with Angio Cath:  22g  IV Site: R Hand  IV Started by:  Cathlyn Parsons, RN  Chest Size (in):  48 Cup Size: n/a  Height: 5\' 10"  (1.778 m)  Weight:  225 lb (102.059 kg)  BMI:  Body mass index is 32.28 kg/(m^2). Tech Comments:  Coreg held x 24 hrs    Nuclear Med Study 1 or 2 day study: 1 day  Stress Test Type:  Stress  Reading MD: Dietrich Pates, MD  Order Authorizing Provider:  Vonna Drafts  Resting Radionuclide: Technetium 13m Sestamibi  Resting Radionuclide Dose: 11.0 mCi   Stress Radionuclide:  Technetium 36m Sestamibi  Stress Radionuclide Dose: 33.0 mCi           Stress Protocol Rest HR: 59 Stress HR: 157  Rest BP: 129/85 Stress BP: 202/82  Exercise Time (min): 8:01 METS: 10.1   Predicted Max HR: 163 bpm % Max HR: 96.32 bpm Rate Pressure Product: 59563   Dose of Adenosine (mg):  n/a Dose of Lexiscan: n/a mg  Dose of Atropine (mg): n/a Dose of Dobutamine: n/a mcg/kg/min (at max HR)  Stress Test Technologist: Smiley Houseman, CMA-N  Nuclear Technologist:  Domenic Polite, CNMT     Rest Procedure:  Myocardial perfusion imaging was performed at rest 45 minutes following the intravenous administration of Technetium 68m Sestamibi.  Rest ECG:  Early repolarization.  Stress Procedure:  The patient exercised on the treadmill utilizing the Bruce protocol for 8:01 minutes. He then stopped due to fatigue and denied any chest pain.  There were no diagnostic ST-T wave changes, occasional PAC's were noted.  He had a hypertensive response to exercise, 202/82.  Technetium 79m Sestamibi was injected at peak exercise and myocardial perfusion imaging was performed after a brief delay.  Stress ECG: No significant change from baseline ECG  QPS Raw Data Images:  images were motion corrected.  Soft tissue (diaphragm) underlies heart. Stress Images:  Normal homogeneous uptake in all areas of the myocardium. Rest Images:  Normal homogeneous uptake in all areas of the myocardium. Subtraction (SDS):  No evidence of ischemia. Transient Ischemic Dilatation (Normal <1.22):  0.99 Lung/Heart Ratio (Normal <0.45):  0.33  Quantitative Gated Spect Images QGS EDV:  99 ml QGS ESV:  40 ml  Impression Exercise Capacity:  Good exercise capacity. BP Response:  Normal blood pressure response. Clinical Symptoms:  No chest pain. ECG Impression:  No significant ST segment change suggestive of ischemia. Comparison with Prior Nuclear Study: No change from report of 2011  Overall Impression:  Normal stress nuclear study.  LV Ejection Fraction: 60%.  LV Wall Motion:  NL LV Function; NL Wall Motion  Dietrich Pates

## 2012-03-26 ENCOUNTER — Encounter: Payer: Self-pay | Admitting: Cardiology

## 2012-03-26 ENCOUNTER — Ambulatory Visit (INDEPENDENT_AMBULATORY_CARE_PROVIDER_SITE_OTHER): Payer: BC Managed Care – PPO | Admitting: Cardiology

## 2012-03-26 VITALS — BP 126/84 | HR 69 | Ht 70.0 in | Wt 232.2 lb

## 2012-03-26 DIAGNOSIS — I10 Essential (primary) hypertension: Secondary | ICD-10-CM

## 2012-03-26 DIAGNOSIS — I2510 Atherosclerotic heart disease of native coronary artery without angina pectoris: Secondary | ICD-10-CM

## 2012-03-26 DIAGNOSIS — E785 Hyperlipidemia, unspecified: Secondary | ICD-10-CM

## 2012-03-26 NOTE — Patient Instructions (Signed)
Continue your current therapy.  Get back into your exercise and weight loss routine.  If your blood pressure remains high let me know.  I will see you in 6 months.

## 2012-03-26 NOTE — Progress Notes (Signed)
Nathan Sampson Date of Birth: 1956-04-06   History of Present Illness: Nathan Sampson is seen for followup today. LAD and distal right coronary with a Promus stents 2009. He has been experiencing some pain in his posterior left shoulder and neck. He had a stress Myoview study this past week. He was able to walk for 8 minutes on the Bruce protocol without chest pain or ECG changes. His Myoview images were normal. Ejection fraction was normal. He did have recent lab work but reports that he was in [place] for 2 weeks prior to this and went off his diet drinking a lot of beer and eating sausage.  Current Outpatient Prescriptions on File Prior to Visit  Medication Sig Dispense Refill  . ALPRAZolam (XANAX) 0.5 MG tablet Take 0.5 mg by mouth at bedtime as needed. sleep      . aspirin 325 MG tablet Take 325 mg by mouth at bedtime.       Marland Kitchen atomoxetine (STRATTERA) 80 MG capsule Take 80 mg by mouth daily before breakfast.       . atorvastatin (LIPITOR) 80 MG tablet Take 80 mg by mouth at bedtime.      . carvedilol (COREG) 12.5 MG tablet Take 1 tablet (12.5 mg total) by mouth 2 (two) times daily with a meal.  180 tablet  3  . Coenzyme Q10 (EQL COQ10) 300 MG CAPS Take 300 mg by mouth daily.       . nitroGLYCERIN (NITROSTAT) 0.4 MG SL tablet Place 0.4 mg under the tongue every 5 (five) minutes as needed. Chest pain       . ramipril (ALTACE) 5 MG tablet Take 5 mg by mouth daily before breakfast.      . testosterone cypionate (DEPOTESTOTERONE CYPIONATE) 100 MG/ML injection Inject 200 mg into the muscle every 14 (fourteen) days. For IM use only      . zolpidem (AMBIEN) 10 MG tablet         Allergies  Allergen Reactions  . Penicillins Anaphylaxis    Past Medical History  Diagnosis Date  . Coronary artery disease     Status post stenting of the mid LAD and distal right coronary with drug-eluting stents in July 2009.  He is Asymptomatic. He had a strss echo in September 2009 which was normal.  . Dyslipidemia     . Hypertension     Poorly Controlled. Exacerbated by sedentary lifestyle, diet and weight gain, and stress.  . OCD (obsessive compulsive disorder)     Past Surgical History  Procedure Date  . Arthroscopic repair acl   . Cardiovascular stress test 11-29-2009    EF 57%  . Hernia repair     AGE 56 - HERNIA REPAIR  . Surgery for sleep apnea     ABOUT 10 YRS AGO-? PALATOPLASTY AND T/A-SURGERY WAS DONE IN [place]    History  Smoking status  . Former Smoker  . Quit date: 06/05/2008  Smokeless tobacco  . Not on file    History  Alcohol Use     Family History  Problem Relation Age of Onset  . Diabetes Father     Review of Systems: As noted in history of present illness. His blood pressure has been higher in the past 4 weeks. All other systems were reviewed and are negative.  Physical Exam: BP 126/84  Pulse 69  Ht 5\' 10"  (1.778 m)  Wt 232 lb 3.2 oz (105.325 kg)  BMI 33.32 kg/m2  SpO2 98% He is an  obese white male in no acute distress. He is normocephalic, atraumatic. Pupils are round and reactive to light accommodation. Extraocular movements are full. Oropharynx is clear. Neck is supple no JVD, adenopathy, thyromegaly, or bruits. Lungs are clear. Cardiac exam reveals a regular rate and rhythm without gallop, murmur, or click. Abdomen is soft and nontender. He has no edema. Pedal pulses are good. LABORATORY DATA: ECG today is normal. Blood work dated 03/11/2012 showed a normal CBC and chemistries. Cholesterol 173, from stress to 44, HDL 33, LDL 91.  Assessment / Plan: 1. Coronary disease with remote stenting of the mid LAD and the right coronary with drug-eluting stents. Continue aspirin and statin therapy.  2. Hypertension. I recommended he get back into his exercise and weight loss routine. If his blood pressure remains elevated we may need to increase his Altace dose.  3. Hyperlipidemia. Elevation of triglycerides consistent with recent dietary changes. Continue on  his current medications.

## 2012-09-23 ENCOUNTER — Other Ambulatory Visit: Payer: Self-pay | Admitting: Cardiology

## 2012-12-31 ENCOUNTER — Encounter: Payer: Self-pay | Admitting: *Deleted

## 2012-12-31 ENCOUNTER — Encounter: Payer: Self-pay | Admitting: Cardiology

## 2013-03-25 ENCOUNTER — Telehealth: Payer: Self-pay

## 2013-03-25 NOTE — Telephone Encounter (Signed)
New problem    Patient was put on indomethacin 50 mg for his hip  twice a day  . After review his side effect. Should he take this medication.

## 2013-03-25 NOTE — Telephone Encounter (Signed)
Returned call to patient no answer.Left message on personal voice mail Dr.Jordan advised no NSAIDS.Advised to take Tylenol as needed for hip pain.

## 2013-03-25 NOTE — Telephone Encounter (Signed)
SPOKE WITH  PT  WAS  RECENTLY DX WITH ARTHRITIS  TO HIP WAS  STARTED  ON INDOMETHACIN   STARTED  TAKING  BEGINNING OF June     HAS TAKEN ON OFF   HAS  CONCERNS  ABOUT TAKING   MED  DUE TO    PAST  HX   AND  CURRENT  MEDS  WILL FORWARD TO DR [place]  FOR  REVIEW .Zack Seal

## 2013-03-25 NOTE — Telephone Encounter (Signed)
I would recommend that he not take NSAIDs chronically given increased CV risk and exacerbation of HTN. Can take Tylenol.  Rasheka Denard [place] MD, [facility]

## 2013-04-02 ENCOUNTER — Encounter (INDEPENDENT_AMBULATORY_CARE_PROVIDER_SITE_OTHER): Payer: Self-pay

## 2013-04-02 ENCOUNTER — Ambulatory Visit (INDEPENDENT_AMBULATORY_CARE_PROVIDER_SITE_OTHER): Payer: BC Managed Care – PPO | Admitting: Cardiology

## 2013-04-02 ENCOUNTER — Encounter: Payer: Self-pay | Admitting: Cardiology

## 2013-04-02 VITALS — BP 126/86 | HR 92 | Ht 70.0 in | Wt 214.8 lb

## 2013-04-02 DIAGNOSIS — I2510 Atherosclerotic heart disease of native coronary artery without angina pectoris: Secondary | ICD-10-CM

## 2013-04-02 DIAGNOSIS — E785 Hyperlipidemia, unspecified: Secondary | ICD-10-CM

## 2013-04-02 DIAGNOSIS — I10 Essential (primary) hypertension: Secondary | ICD-10-CM

## 2013-04-02 MED ORDER — FUROSEMIDE 20 MG PO TABS: 20.0000 mg | ORAL_TABLET | Freq: Every day | ORAL | Status: DC | PRN

## 2013-04-02 NOTE — Progress Notes (Signed)
Nathan Sampson Date of Birth: 11-07-1955   History of Present Illness: Nathan Sampson is seen for followup today. He is status post stenting of the LAD and distal right coronary with  Promus stents in 2009. He had a normal stress Myoview study this past year. He is doing very well from a cardiac standpoint without a symptoms of chest pain, shortness of breath, or palpitations. He is exercising vigorously without difficulty. He has lost 18 pounds with change in his diet. He does note that he gained almost 15 pounds every time he travels on an airplane. He treats this with when necessary Lasix.  Current Outpatient Prescriptions on File Prior to Visit  Medication Sig Dispense Refill  . ALPRAZolam (XANAX) 0.5 MG tablet Take 0.5 mg by mouth at bedtime as needed. sleep      . aspirin 325 MG tablet Take 325 mg by mouth at bedtime.       Marland Kitchen atomoxetine (STRATTERA) 80 MG capsule Take 80 mg by mouth daily before breakfast.       . atorvastatin (LIPITOR) 80 MG tablet Take 80 mg by mouth at bedtime.      . carvedilol (COREG) 12.5 MG tablet TAKE ONE TABLET BY MOUTH TWICE DAILY WITH MEALS  180 tablet  1  . Coenzyme Q10 (EQL COQ10) 300 MG CAPS Take 300 mg by mouth daily.       . nitroGLYCERIN (NITROSTAT) 0.4 MG SL tablet Place 0.4 mg under the tongue every 5 (five) minutes as needed. Chest pain       . ramipril (ALTACE) 5 MG tablet Take 5 mg by mouth daily before breakfast.      . testosterone cypionate (DEPOTESTOTERONE CYPIONATE) 100 MG/ML injection Inject 200 mg into the muscle every 14 (fourteen) days. For IM use only      . zolpidem (AMBIEN) 10 MG tablet        No current facility-administered medications on file prior to visit.    Allergies  Allergen Reactions  . Penicillins Anaphylaxis    Past Medical History  Diagnosis Date  . Coronary artery disease     Status post stenting of the mid LAD and distal right coronary with drug-eluting stents in July 2009.  He is Asymptomatic. He had a strss echo in  September 2009 which was normal.  . Dyslipidemia   . Hypertension     Poorly Controlled. Exacerbated by sedentary lifestyle, diet and weight gain, and stress.  . OCD (obsessive compulsive disorder)     Past Surgical History  Procedure Laterality Date  . Arthroscopic repair acl    . Cardiovascular stress test  11-29-2009    EF 57%  . Hernia repair      AGE 57 - HERNIA REPAIR  . Surgery for sleep apnea      ABOUT 10 YRS AGO-? PALATOPLASTY AND T/A-SURGERY WAS DONE IN [place]    History  Smoking status  . Former Smoker  . Quit date: 06/05/2008  Smokeless tobacco  . Not on file    History  Alcohol Use     Family History  Problem Relation Age of Onset  . Diabetes Father     Review of Systems: As noted in history of present illness. He reports symptoms of right testicular and posterior sacral pain when he Sampson flat. At times when he is lying down he has difficulty lifting his right leg. He thinks he may have some type of nerve impingement. He has chronic limitation of his right shoulder.  All other systems were reviewed and are negative.  Physical Exam: BP 126/86  Pulse 92  Ht 5\' 10"  (1.778 m)  Wt 214 lb 12.8 oz (97.433 kg)  BMI 30.82 kg/m2 He is a white male in no acute distress. HEENT exam is normal. Neck is supple no JVD, adenopathy, thyromegaly, or bruits. Lungs are clear. Cardiac exam reveals a regular rate and rhythm without gallop, murmur, or click. Abdomen is soft and nontender. He has no edema. Pedal pulses are good. LABORATORY DATA: ECG today is normal. Normal Myoview study this past year.  Assessment / Plan: 1. Coronary disease with remote stenting of the mid LAD and the right coronary with drug-eluting stents. Continue aspirin and statin therapy.  2. Hypertension. Improved control with weight loss and exercise. We will stop chlorthalidone. Continue Altace and carvedilol.  3. Hyperlipidemia.  Continue on his current medications.  4. Edema associated  with travel. We'll use Lasix on an as-needed basis. I recommended avoidance of nonsteroidal anti-inflammatory drugs. I have recommended avoidance of nonsteroidal anti-inflammatory drugs.

## 2013-04-02 NOTE — Patient Instructions (Signed)
Stop chlorthalidone  Take Lasix 20 mg daily as needed for swelling  Continue your other therapy  Avoid NSAIDs  I will see you in 6 months.

## 2013-05-09 ENCOUNTER — Other Ambulatory Visit: Payer: Self-pay

## 2013-05-09 ENCOUNTER — Other Ambulatory Visit: Payer: Self-pay | Admitting: *Deleted

## 2013-05-09 MED ORDER — CARVEDILOL 12.5 MG PO TABS: ORAL_TABLET | ORAL | Status: DC

## 2013-11-02 ENCOUNTER — Other Ambulatory Visit: Payer: Self-pay | Admitting: Cardiology

## 2014-01-20 ENCOUNTER — Other Ambulatory Visit: Payer: Self-pay | Admitting: Sports Medicine

## 2014-01-20 ENCOUNTER — Encounter: Payer: Self-pay | Admitting: Sports Medicine

## 2014-01-20 ENCOUNTER — Ambulatory Visit (INDEPENDENT_AMBULATORY_CARE_PROVIDER_SITE_OTHER): Payer: BLUE CROSS/BLUE SHIELD | Admitting: Sports Medicine

## 2014-01-20 ENCOUNTER — Ambulatory Visit (INDEPENDENT_AMBULATORY_CARE_PROVIDER_SITE_OTHER): Payer: BC Managed Care – PPO

## 2014-01-20 VITALS — BP 116/74 | HR 61 | Ht 70.0 in | Wt 219.0 lb

## 2014-01-20 DIAGNOSIS — M1610 Unilateral primary osteoarthritis, unspecified hip: Secondary | ICD-10-CM

## 2014-01-20 DIAGNOSIS — M79671 Pain in right foot: Secondary | ICD-10-CM

## 2014-01-20 DIAGNOSIS — Z96641 Presence of right artificial hip joint: Secondary | ICD-10-CM | POA: Insufficient documentation

## 2014-01-20 DIAGNOSIS — M25579 Pain in unspecified ankle and joints of unspecified foot: Secondary | ICD-10-CM

## 2014-01-20 DIAGNOSIS — M1611 Unilateral primary osteoarthritis, right hip: Secondary | ICD-10-CM

## 2014-01-20 DIAGNOSIS — M19079 Primary osteoarthritis, unspecified ankle and foot: Secondary | ICD-10-CM

## 2014-01-20 DIAGNOSIS — M7750 Other enthesopathy of unspecified foot: Secondary | ICD-10-CM | POA: Insufficient documentation

## 2014-01-20 DIAGNOSIS — M25572 Pain in left ankle and joints of left foot: Secondary | ICD-10-CM

## 2014-01-20 DIAGNOSIS — M79609 Pain in unspecified limb: Secondary | ICD-10-CM | POA: Diagnosis not present

## 2014-01-20 MED ORDER — HYDROCODONE-ACETAMINOPHEN 7.5-325 MG PO TABS: 1.0000 | ORAL_TABLET | Freq: Three times a day (TID) | ORAL | Status: DC | PRN

## 2014-01-20 NOTE — Assessment & Plan Note (Signed)
Femoral acetabular injection as above. X-rays. Return in a month.

## 2014-01-20 NOTE — Assessment & Plan Note (Signed)
There is some osteoarthritis of the right first metatarsophalangeal joint, he also has a sprain of the second proximal interphalangeal joint. We will add a short course of hydrocodone, and a tincture of time.

## 2014-01-20 NOTE — Assessment & Plan Note (Signed)
This is a simple lateral ankle sprain. ASO, home rehabilitation. Return in a month.

## 2014-01-20 NOTE — Progress Notes (Signed)
Subjective:    I'm seeing this patient as a consultation for:  Dr. 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 Unfortunately he was building stairs on his back, fall, and inverted his left ankle and jammed his right second toe and first toe. The ankle is swollen, both toes are swollen with pain predominantly at the metatarsophalangeal joint. Ankle pain is moderate, persistent without radiation it localized over the ATF L.  On further questioning he also has some right-sided back and hip pain, localized in the groin and buttock, worse with weightbearing and laying in bed at night.  He would also like to consider transitioning care to me.  Past medical history, Surgical history, Family history not pertinant except as noted below, Social history, Allergies, and medications have been entered into the medical record, reviewed, and no changes needed.   Review of Systems: No headache, visual changes, nausea, vomiting, diarrhea, constipation, dizziness, abdominal pain, skin rash, fevers, chills, night sweats, weight loss, swollen lymph nodes, body aches, joint swelling, muscle aches, chest pain, shortness of breath, mood changes, visual or auditory hallucinations.   Objective:   General: Well Developed, well nourished, and in no acute distress.  Neuro/Psych: Alert and oriented x3, extra-ocular muscles intact, able to move all 4 extremities, sensation grossly intact. Skin: Warm and dry, no rashes noted.  Respiratory: Not using accessory muscles, speaking in full sentences, trachea midline.  Cardiovascular: Pulses palpable, no extremity edema. Abdomen: Does not appear distended. Left Ankle: Visibly swollen Range of motion is full in all directions. Strength is 5/5 in all directions. Stable lateral and medial ligaments; squeeze test and kleiger test unremarkable; Talar dome nontender; Tender to  palpation over the ATF L. No pain at base of 5th MT; No tenderness over cuboid; No tenderness over N spot or navicular prominence No tenderness on posterior aspects of lateral and medial malleolus No sign of peroneal tendon subluxations; Negative tarsal tunnel tinel's Able to walk 4 steps. Right Foot: No visible erythema or swelling. Range of motion is full in all directions. Strength is 5/5 in all directions. No hallux valgus. No pes cavus or pes planus. No abnormal callus noted. No pain over the navicular prominence, or base of fifth metatarsal. No tenderness to palpation of the calcaneal insertion of plantar fascia. No pain at the Achilles insertion. No pain over the calcaneal bursa. No pain of the retrocalcaneal bursa. Palpation of the second proximal interphalangeal joint and at the first metatarsophalangeal joint with swelling. No pain with compression of the metatarsal heads. Neurovascularly intact distally. Right Hip: ROM IR: 10 Deg with reproduction of pain, ER: 60 Deg, Flexion: 120 Deg, Extension: 100 Deg, Abduction: 45 Deg, Adduction: 45 Deg Strength IR: 5/5, ER: 5/5, Flexion: 5/5, Extension: 5/5, Abduction: 5/5, Adduction: 5/5 Pelvic alignment unremarkable to inspection and palpation. Standing hip rotation and gait without trendelenburg / unsteadiness. Greater trochanter without tenderness to palpation. No tenderness over piriformis. No SI joint tenderness and normal minimal SI movement.  Ankle and foot x-rays are unremarkable and negative for fracture, there is some first metatarsophalangeal joint osteoarthritis in the right foot.  Procedure: Real-time Ultrasound Guided Injection of right femoral acetabular joint Device: GE Logiq E  Verbal informed consent obtained.  Time-out conducted.  Noted no overlying erythema, induration, or other signs of local infection.  Skin prepped in a sterile fashion.  Local anesthesia: Topical Ethyl chloride.  With sterile  technique and under real time ultrasound guidance: Spinal needle advanced to the femoral head/neck junction, 2 cc kenalog 40 of 4 cc lidocaine injected easily.   Completed without difficulty  Pain immediately resolved suggesting accurate placement of the medication.  Advised to call if fevers/chills, erythema, induration, drainage, or persistent bleeding.  Images permanently stored and available for review in the ultrasound unit.  Impression: Technically successful ultrasound guided injection.  Impression and Recommendations:   This case required medical decision making of moderate complexity.

## 2014-01-30 ENCOUNTER — Encounter: Payer: Self-pay | Admitting: Cardiology

## 2014-01-30 ENCOUNTER — Ambulatory Visit (INDEPENDENT_AMBULATORY_CARE_PROVIDER_SITE_OTHER): Payer: BC Managed Care – PPO | Admitting: Cardiology

## 2014-01-30 VITALS — BP 126/84 | HR 60 | Ht 70.0 in | Wt 212.7 lb

## 2014-01-30 DIAGNOSIS — E785 Hyperlipidemia, unspecified: Secondary | ICD-10-CM

## 2014-01-30 DIAGNOSIS — I10 Essential (primary) hypertension: Secondary | ICD-10-CM

## 2014-01-30 DIAGNOSIS — I2510 Atherosclerotic heart disease of native coronary artery without angina pectoris: Secondary | ICD-10-CM

## 2014-01-30 NOTE — Progress Notes (Signed)
Nathan Sampson Date of Birth: Sep 02, 1955   History of Present Illness: Nathan Sampson is seen for followup today. He is status post stenting of the LAD and distal right coronary with  Promus stents in 2009. He had a normal stress Myoview study October 2013. He is doing very well from a cardiac standpoint without a symptoms of chest pain, shortness of breath, or palpitations. He isn't exercising as much but has been building a deck on his house. He does gain 10 lbs of fluid when he flies.  He treats this with when necessary Lasix. He has noted his BP dropping to 90/70 at times with lightheadedness.  Current Outpatient Prescriptions on File Prior to Visit  Medication Sig Dispense Refill  . ALPRAZolam (XANAX) 0.5 MG tablet Take 0.5 mg by mouth at bedtime as needed. sleep      . aspirin 325 MG tablet Take 325 mg by mouth at bedtime.       Marland Kitchen atorvastatin (LIPITOR) 80 MG tablet Take 80 mg by mouth at bedtime.      . carvedilol (COREG) 12.5 MG tablet TAKE 1 TABLET BY MOUTH TWICE A DAY WITH MEALS  180 tablet  0  . Coenzyme Q10 (EQL COQ10) 300 MG CAPS Take 300 mg by mouth daily.       . furosemide (LASIX) 20 MG tablet Take 1 tablet (20 mg total) by mouth daily as needed for edema.  90 tablet  3  . HYDROcodone-acetaminophen (NORCO) 7.5-325 MG per tablet Take 1 tablet by mouth every 8 (eight) hours as needed.  40 tablet  0  . nitroGLYCERIN (NITROSTAT) 0.4 MG SL tablet Place 0.4 mg under the tongue every 5 (five) minutes as needed. Chest pain       . ramipril (ALTACE) 5 MG tablet Take 5 mg by mouth 2 (two) times daily.       Marland Kitchen testosterone cypionate (DEPOTESTOTERONE CYPIONATE) 100 MG/ML injection Inject 200 mg into the muscle every 14 (fourteen) days. For IM use only      . zolpidem (AMBIEN) 10 MG tablet Take 10 mg by mouth at bedtime as needed for sleep.        No current facility-administered medications on file prior to visit.    Allergies  Allergen Reactions  . Penicillins Anaphylaxis    Past Medical  History  Diagnosis Date  . Coronary artery disease     Status post stenting of the mid LAD and distal right coronary with drug-eluting stents in July 2009.  He is Asymptomatic. He had a strss echo in September 2009 which was normal.  . Dyslipidemia   . Hypertension     Poorly Controlled. Exacerbated by sedentary lifestyle, diet and weight gain, and stress.  . OCD (obsessive compulsive disorder)     Past Surgical History  Procedure Laterality Date  . Arthroscopic repair acl    . Cardiovascular stress test  11-29-2009    EF 57%  . Hernia repair      AGE 58 - HERNIA REPAIR  . Surgery for sleep apnea      ABOUT 10 YRS AGO-? PALATOPLASTY AND T/A-SURGERY WAS DONE IN [place]    History  Smoking status  . Former Smoker  . Quit date: 06/05/2008  Smokeless tobacco  . Not on file    History  Alcohol Use     Family History  Problem Relation Age of Onset  . Diabetes Father     Review of Systems: As noted in history of present  illness. He fell recently and sprained his ankle.  All other systems were reviewed and are negative.  Physical Exam: BP 126/84  Pulse 60  Ht 5\' 10"  (1.778 m)  Wt 212 lb 11.2 oz (96.48 kg)  BMI 30.52 kg/m2 He is a white male in no acute distress. HEENT exam is normal. Neck is supple no JVD, adenopathy, thyromegaly, or bruits. Lungs are clear. Cardiac exam reveals a regular rate and rhythm without gallop, murmur, or click. Abdomen is soft and nontender. He has no edema. Pedal pulses are good.  LABORATORY DATA:   Assessment / Plan: 1. Coronary disease with remote stenting of the mid LAD and the right coronary with drug-eluting stents. Normal Myoview in Oct. 2013. Continue aspirin and statin therapy.  2. Hypertension. Improved control. Will decrease Altace to 5 mg daily.  3. Hyperlipidemia.  Continue on his current medications. Plans to have lab work with primary care in next 2 weeks.  4. Edema associated with travel. We'll use Lasix on an  as-needed basis.  I will follow up in 6 months.

## 2014-01-30 NOTE — Patient Instructions (Addendum)
Reduce ramipril to 5 mg daily  Continue your other therapy  I will see you in 6 months

## 2014-02-03 ENCOUNTER — Ambulatory Visit (INDEPENDENT_AMBULATORY_CARE_PROVIDER_SITE_OTHER): Payer: BLUE CROSS/BLUE SHIELD | Admitting: Sports Medicine

## 2014-02-03 ENCOUNTER — Encounter: Payer: Self-pay | Admitting: Sports Medicine

## 2014-02-03 VITALS — BP 109/74 | HR 57 | Ht 70.0 in | Wt 215.0 lb

## 2014-02-03 DIAGNOSIS — M1610 Unilateral primary osteoarthritis, unspecified hip: Secondary | ICD-10-CM

## 2014-02-03 DIAGNOSIS — R1031 Right lower quadrant pain: Secondary | ICD-10-CM | POA: Insufficient documentation

## 2014-02-03 DIAGNOSIS — E785 Hyperlipidemia, unspecified: Secondary | ICD-10-CM

## 2014-02-03 DIAGNOSIS — Z0000 Encounter for general adult medical examination without abnormal findings: Secondary | ICD-10-CM | POA: Insufficient documentation

## 2014-02-03 DIAGNOSIS — G4700 Insomnia, unspecified: Secondary | ICD-10-CM | POA: Insufficient documentation

## 2014-02-03 DIAGNOSIS — J309 Allergic rhinitis, unspecified: Secondary | ICD-10-CM | POA: Insufficient documentation

## 2014-02-03 DIAGNOSIS — E291 Testicular hypofunction: Secondary | ICD-10-CM

## 2014-02-03 DIAGNOSIS — R109 Unspecified abdominal pain: Secondary | ICD-10-CM | POA: Diagnosis not present

## 2014-02-03 DIAGNOSIS — I10 Essential (primary) hypertension: Secondary | ICD-10-CM

## 2014-02-03 DIAGNOSIS — R21 Rash and other nonspecific skin eruption: Secondary | ICD-10-CM | POA: Diagnosis not present

## 2014-02-03 DIAGNOSIS — J3089 Other allergic rhinitis: Secondary | ICD-10-CM

## 2014-02-03 DIAGNOSIS — M1611 Unilateral primary osteoarthritis, right hip: Secondary | ICD-10-CM

## 2014-02-03 MED ORDER — HYDROCOD POLST-CPM POLST ER 10-8 MG PO CP12: 1.0000 | ORAL_CAPSULE | Freq: Two times a day (BID) | ORAL | Status: DC | PRN

## 2014-02-03 MED ORDER — CLOTRIMAZOLE-BETAMETHASONE 1-0.05 % EX CREA: 1.0000 "application " | TOPICAL_CREAM | Freq: Two times a day (BID) | CUTANEOUS | Status: DC

## 2014-02-03 NOTE — Progress Notes (Signed)
Patient ID: Nathan Sampson, male   DOB: 02/22/1956, 58 y.o.   MRN: [id number]  Subjective:    CC: Establishment of care, acute complaints of groin pain and post-nasal drip  HPI: Nathan Sampson is a very pleasant 58 year old male with history of hypertension, hyperlipidemia, coronary artery disease s/p stent placement, and right hip osteoarthritis who is here to establish care as a new patient. Reports that his hypertension is currently well-controlled with ramipril 5 mg bid and furosemide 20 mg daily. Hyperlipidemia well-controlled with atorvastatin 80 mg daily, and he is interested in reducing this dose if possible. Previously had two stents placed after positive stress test and is currently on aspirin 325 mg daily and carvedilol 12.5 mg bid. No recent chest pain or shortness of breath. Has had low testosterone in the past, controlled with injections and creams. Also has a history of insomnia that is well-controlled with zolpidem 10 mg PRN. We have previously injected the right hip and he reports significant improvement after the shot. Also requests lotrisone cream that he has previously used for an inguinal rash and tussionex for cough when traveling.  Had an acute complaint of pain in the right side of the groin that has been present for some time. No sensation of testicular fullness or new bulge in the inguinal region. He does have a history of vasectomy many years ago. No urinary frequency of urgency. Also acute complaint of increasing need to clear his throat with associated post-nasal drip. No retrosternal pain, postprandial discomfort, or globus sensation. Reports that this does not feel like his previous episodes of GERD.  Past medical history, Surgical history, Family history not pertinant except as noted below, Social history, Allergies, and medications have been entered into the medical record, reviewed, and no changes needed.   Review of Systems: No fevers, chills, night sweats, weight loss, chest  pain, or shortness of breath.   Objective:    General: Well Developed, well nourished, and in no acute distress.  Neuro: Alert and oriented x3, extra-ocular muscles intact, sensation grossly intact.  HEENT: Normocephalic, atraumatic, pupils equal round reactive to light, neck supple, no masses, no lymphadenopathy, thyroid nonpalpable.  Skin: Warm and dry, no rashes. Cardiac: Regular rate and rhythm, no murmurs rubs or gallops, no lower extremity edema.  Respiratory: Clear to auscultation bilaterally. Not using accessory muscles, speaking in full sentences. Genitourinary: Pain with palpation at the internal inguinal ring with tenderness to palpation at the spermatic cord. No evidence of hernia in the inguinal canal. No visible rashes or lesions.  Impression and Recommendations:    1. Right Groin Pain: Pain at the internal inguinal ring with tenderness to palpation at the spermatic cord is present. Referral to urology. 2. Osteoarthritis of Right Hip: Pain has resolved after injection. Advised that injection could be repeated if pain returns. 3. Rash: Occasional inguinal rash previously well-controlled with lotrisone cream, will continue to prescribe as needed. 4. Insomnia: Insomnia currently well-controlled on Ambien, no changes needed. 5. Hypertension: Hypertension is well-controlled, no changes needed. 6. Dyslipidemia: Recheck lipids today and will consider decreasing statin dose if lipids are currently well-controlled. 7. Cough: As tussionex capsules have previously controlled his cough associated with traveling, will continue to prescribe as needed. 8. Allergic Rhinitis: Report of throat clearing and post-nasal drip without substernal discomfort, morning or postprandial discomfort, or discomfort while lying down is suggestive of allergic rhinitis. Suggested use of OTC Nasacort.

## 2014-02-03 NOTE — Assessment & Plan Note (Signed)
Rechecking lipids, we can consider decreasing his statin dose if well-controlled.

## 2014-02-03 NOTE — Assessment & Plan Note (Signed)
Continue Ambien. 

## 2014-02-03 NOTE — Assessment & Plan Note (Signed)
Pain resolved after injection.

## 2014-02-03 NOTE — Assessment & Plan Note (Signed)
Does get an occasional cough and traveling. Adding Tussionex capsules.

## 2014-02-03 NOTE — Assessment & Plan Note (Addendum)
Use over-the-counter Nasacort  Adding Tessalon Perles and dextromethorphan for occasional cough

## 2014-02-03 NOTE — Assessment & Plan Note (Signed)
Adding Lotrisone. 

## 2014-02-03 NOTE — Assessment & Plan Note (Signed)
Well controlled. No changes. 

## 2014-02-03 NOTE — Assessment & Plan Note (Signed)
History of a vasectomy years ago. Hip pain has been taken care of with an intra-articular injection at the last visit. Now having pain at the internal inguinal ring with tenderness to palpation at the spermatic cord. Referral to urology for assistance in diagnosis and management.

## 2014-02-04 ENCOUNTER — Other Ambulatory Visit: Payer: Self-pay | Admitting: Cardiology

## 2014-02-04 LAB — COMPREHENSIVE METABOLIC PANEL
ALT: 29 U/L (ref 0–53)
AST: 22 U/L (ref 0–37)
Albumin: 4.1 g/dL (ref 3.5–5.2)
BUN: 15 mg/dL (ref 6–23)
CO2: 26 mEq/L (ref 19–32)
Calcium: 9.3 mg/dL (ref 8.4–10.5)
Chloride: 103 mEq/L (ref 96–112)
Creat: 0.99 mg/dL (ref 0.50–1.35)
Potassium: 4.8 mEq/L (ref 3.5–5.3)

## 2014-02-04 LAB — CBC
HCT: 45.7 % (ref 39.0–52.0)
Hemoglobin: 15.2 g/dL (ref 13.0–17.0)
MCH: 29.6 pg (ref 26.0–34.0)
MCHC: 33.3 g/dL (ref 30.0–36.0)
MCV: 89.1 fL (ref 78.0–100.0)
Platelets: 210 10*3/uL (ref 150–400)
RBC: 5.13 MIL/uL (ref 4.22–5.81)
RDW: 13.5 % (ref 11.5–15.5)
WBC: 7.9 10*3/uL (ref 4.0–10.5)

## 2014-02-04 LAB — COMPREHENSIVE METABOLIC PANEL WITH GFR
Alkaline Phosphatase: 52 U/L (ref 39–117)
Glucose, Bld: 88 mg/dL (ref 70–99)
Sodium: 138 meq/L (ref 135–145)
Total Bilirubin: 0.8 mg/dL (ref 0.2–1.2)
Total Protein: 6.5 g/dL (ref 6.0–8.3)

## 2014-02-04 LAB — HEMOGLOBIN A1C
Hgb A1c MFr Bld: 5.8 % — ABNORMAL HIGH (ref <5.7)
Mean Plasma Glucose: 120 mg/dL — ABNORMAL HIGH (ref <117)

## 2014-02-04 LAB — LIPID PANEL
Cholesterol: 144 mg/dL (ref 0–200)
HDL: 48 mg/dL (ref >39)
LDL Cholesterol: 72 mg/dL (ref 0–99)
Total CHOL/HDL Ratio: 3 ratio
Triglycerides: 122 mg/dL (ref <150)
VLDL: 24 mg/dL (ref 0–40)

## 2014-02-04 LAB — TESTOSTERONE: Testosterone: 376 ng/dL (ref 300–890)

## 2014-02-04 LAB — TSH: TSH: 1.739 u[IU]/mL (ref 0.350–4.500)

## 2014-02-04 LAB — VITAMIN D 25 HYDROXY (VIT D DEFICIENCY, FRACTURES): Vit D, 25-Hydroxy: 65 ng/mL (ref 30–89)

## 2014-02-05 DIAGNOSIS — E291 Testicular hypofunction: Secondary | ICD-10-CM | POA: Insufficient documentation

## 2014-02-05 NOTE — Assessment & Plan Note (Addendum)
Testosterone 200 mg intramuscular Q2 weeks, please check testosterone levels one week after the second injection.  Okay to do home injections, testosterone prescription given, 200 mg every 2 weeks, recheck levels after second injection, one week

## 2014-02-05 NOTE — Addendum Note (Signed)
Addended by: Silverio Decamp on: 02/05/2014 09:29 AM   Modules accepted: Orders

## 2014-02-06 MED ORDER — TESTOSTERONE CYPIONATE 200 MG/ML IM SOLN: 200.0000 mg | INTRAMUSCULAR | Status: DC

## 2014-02-06 MED ORDER — DEXTROMETHORPHAN POLISTIREX 30 MG/5ML PO LQCR: 30.0000 mg | Freq: Two times a day (BID) | ORAL | Status: DC

## 2014-02-06 MED ORDER — BENZONATATE 200 MG PO CAPS: 200.0000 mg | ORAL_CAPSULE | Freq: Three times a day (TID) | ORAL | Status: DC | PRN

## 2014-02-06 NOTE — Addendum Note (Signed)
Addended by: Silverio Decamp on: 02/06/2014 12:08 PM   Modules accepted: Orders

## 2014-02-17 ENCOUNTER — Ambulatory Visit: Payer: BC Managed Care – PPO | Admitting: Sports Medicine

## 2014-02-19 ENCOUNTER — Ambulatory Visit: Payer: BC Managed Care – PPO | Admitting: Sports Medicine

## 2014-02-19 ENCOUNTER — Ambulatory Visit (INDEPENDENT_AMBULATORY_CARE_PROVIDER_SITE_OTHER): Payer: BLUE CROSS/BLUE SHIELD | Admitting: Sports Medicine

## 2014-02-19 ENCOUNTER — Encounter: Payer: Self-pay | Admitting: Sports Medicine

## 2014-02-19 VITALS — BP 108/72 | HR 52 | Ht 70.0 in | Wt 209.0 lb

## 2014-02-19 DIAGNOSIS — H538 Other visual disturbances: Secondary | ICD-10-CM | POA: Diagnosis not present

## 2014-02-19 MED ORDER — TOBRAMYCIN 0.3 % OP SOLN: 2.0000 [drp] | Freq: Four times a day (QID) | OPHTHALMIC | Status: DC

## 2014-02-19 NOTE — Assessment & Plan Note (Addendum)
Jamon describes blurry vision in the entire visual field on the right eye. Left eye is unaffected. He denies overt vision loss, no focal neurologic symptoms, no headaches, he did have some trauma in that he fell asleep with his eye resting on his hand for several hours. His eye does feel sensitive. Checking blood work including ESR, brain MRI, referral to ophthalmology. Her also going to do topical ciprofloxacin as I did see a corneal abrasion on fluorescein examination, funduscopy was unremarkable.  Patient did see ophthalmology who also agreed with corneal abrasion, discontinuing blood work and brain MRI.

## 2014-02-19 NOTE — Progress Notes (Signed)
  Subjective:    CC: Right vision blurred  HPI: This is a pleasant 58 year old male, for the past several days he's noted blurry vision and a stinging sensation in his right eye. This started after a long night at work, he fell asleep with his head resting on his hand, he then noted a stinging sensation in his eye, and blurry vision without overt vision loss. No headaches, no focal neurologic deficits, no facial droop, no dysarthria. Left eye is completely normal. He was seen in urgent care in [place], very little workup was done. Symptoms are moderate, improving.  Past medical history, Surgical history, Family history not pertinant except as noted below, Social history, Allergies, and medications have been entered into the medical record, reviewed, and no changes needed.   Review of Systems: No fevers, chills, night sweats, weight loss, chest pain, or shortness of breath.   Objective:    General: Well Developed, well nourished, and in no acute distress.  Neuro: Alert and oriented x3, extra-ocular muscles intact, sensation grossly intact.  HEENT: Normocephalic, atraumatic, pupils equal round reactive to light, neck supple, no masses, no lymphadenopathy, thyroid nonpalpable. Fluorescein examination with tetracaine analgesia was performed and showed a fairly diffuse corneal abrasion, fundoscopy was normal. Skin: Warm and dry, no rashes. Cardiac: Regular rate and rhythm, no murmurs rubs or gallops, no lower extremity edema.  Respiratory: Clear to auscultation bilaterally. Not using accessory muscles, speaking in full sentences.   Impression and Recommendations:    I spent 40 minutes with this patient, greater than 50% was face-to-face time counseling regarding the above diagnoses.

## 2014-02-27 ENCOUNTER — Encounter: Payer: Self-pay | Admitting: Sports Medicine

## 2014-02-27 ENCOUNTER — Ambulatory Visit (INDEPENDENT_AMBULATORY_CARE_PROVIDER_SITE_OTHER): Payer: BLUE CROSS/BLUE SHIELD | Admitting: Sports Medicine

## 2014-02-27 VITALS — BP 124/85 | HR 59 | Wt 211.0 lb

## 2014-02-27 DIAGNOSIS — E291 Testicular hypofunction: Secondary | ICD-10-CM

## 2014-02-27 DIAGNOSIS — H538 Other visual disturbances: Secondary | ICD-10-CM | POA: Diagnosis not present

## 2014-02-27 DIAGNOSIS — M1610 Unilateral primary osteoarthritis, unspecified hip: Secondary | ICD-10-CM

## 2014-02-27 DIAGNOSIS — M1611 Unilateral primary osteoarthritis, right hip: Secondary | ICD-10-CM

## 2014-02-27 MED ORDER — TESTOSTERONE CYPIONATE 200 MG/ML IM SOLN
200.0000 mg | INTRAMUSCULAR | Status: DC
  Administered 2014-02-27: 200 mg via INTRAMUSCULAR

## 2014-02-27 NOTE — Assessment & Plan Note (Signed)
Testosterone injection as above. Check testosterone levels one week after second shot.

## 2014-02-27 NOTE — Assessment & Plan Note (Signed)
Resolved with injection.  

## 2014-02-27 NOTE — Assessment & Plan Note (Signed)
Clinically resolved. Likely represented a corneal abrasion, he did see ophthalmology, diagnosis was corroborated. Continue topical antibiotic drops, return as needed for this.

## 2014-02-27 NOTE — Progress Notes (Signed)
  Subjective:    CC: Followup  HPI: Blurry vision: Diagnosed corneal abrasion, doing better.  Male hypogonadism : testosterone injection #1 today, he is going to do home injections.  Right hip osteoarthritis colon pain resolved after injection.  Past medical history, Surgical history, Family history not pertinant except as noted below, Social history, Allergies, and medications have been entered into the medical record, reviewed, and no changes needed.   Review of Systems: No fevers, chills, night sweats, weight loss, chest pain, or shortness of breath.   Objective:    General: Well Developed, well nourished, and in no acute distress.  Neuro: Alert and oriented x3, extra-ocular muscles intact, sensation grossly intact.  HEENT: Normocephalic, atraumatic, pupils equal round reactive to light, neck supple, no masses, no lymphadenopathy, thyroid nonpalpable.  Skin: Warm and dry, no rashes. Cardiac: Regular rate and rhythm, no murmurs rubs or gallops, no lower extremity edema.  Respiratory: Clear to auscultation bilaterally. Not using accessory muscles, speaking in full sentences.  Impression and Recommendations:

## 2014-03-09 ENCOUNTER — Ambulatory Visit: Payer: BC Managed Care – PPO | Admitting: Sports Medicine

## 2014-03-20 ENCOUNTER — Other Ambulatory Visit: Payer: Self-pay

## 2014-03-20 ENCOUNTER — Telehealth: Payer: Self-pay | Admitting: Sports Medicine

## 2014-03-20 MED ORDER — ATORVASTATIN CALCIUM 80 MG PO TABS: 80.0000 mg | ORAL_TABLET | Freq: Every day | ORAL | Status: DC

## 2014-03-20 NOTE — Telephone Encounter (Signed)
Done

## 2014-03-20 NOTE — Telephone Encounter (Signed)
Patient has only about a week left of Lipitor and needs refill called in.  He has follow up in Dec.  He will come in sooner if he needs to.  Called into [facility]. Thanks!

## 2014-03-21 LAB — TESTOSTERONE: Testosterone: 895 ng/dL — ABNORMAL HIGH (ref 300–890)

## 2014-05-12 ENCOUNTER — Ambulatory Visit (INDEPENDENT_AMBULATORY_CARE_PROVIDER_SITE_OTHER): Payer: BC Managed Care – PPO

## 2014-05-12 ENCOUNTER — Ambulatory Visit (INDEPENDENT_AMBULATORY_CARE_PROVIDER_SITE_OTHER): Payer: BC Managed Care – PPO | Admitting: Sports Medicine

## 2014-05-12 ENCOUNTER — Telehealth: Payer: Self-pay | Admitting: *Deleted

## 2014-05-12 VITALS — BP 123/79 | HR 60 | Ht 70.0 in | Wt 227.0 lb

## 2014-05-12 DIAGNOSIS — M545 Low back pain, unspecified: Secondary | ICD-10-CM

## 2014-05-12 DIAGNOSIS — I10 Essential (primary) hypertension: Secondary | ICD-10-CM | POA: Diagnosis not present

## 2014-05-12 DIAGNOSIS — E785 Hyperlipidemia, unspecified: Secondary | ICD-10-CM

## 2014-05-12 DIAGNOSIS — Z7184 Encounter for health counseling related to travel: Secondary | ICD-10-CM | POA: Insufficient documentation

## 2014-05-12 DIAGNOSIS — G4700 Insomnia, unspecified: Secondary | ICD-10-CM | POA: Diagnosis not present

## 2014-05-12 DIAGNOSIS — M47897 Other spondylosis, lumbosacral region: Secondary | ICD-10-CM

## 2014-05-12 DIAGNOSIS — M47816 Spondylosis without myelopathy or radiculopathy, lumbar region: Secondary | ICD-10-CM | POA: Insufficient documentation

## 2014-05-12 DIAGNOSIS — Z7189 Other specified counseling: Secondary | ICD-10-CM

## 2014-05-12 MED ORDER — CIPROFLOXACIN HCL 500 MG PO TABS: 500.0000 mg | ORAL_TABLET | Freq: Two times a day (BID) | ORAL | Status: DC

## 2014-05-12 MED ORDER — FUROSEMIDE 20 MG PO TABS: 20.0000 mg | ORAL_TABLET | Freq: Every day | ORAL | Status: DC | PRN

## 2014-05-12 MED ORDER — CARVEDILOL 12.5 MG PO TABS: 12.5000 mg | ORAL_TABLET | Freq: Two times a day (BID) | ORAL | Status: DC

## 2014-05-12 MED ORDER — ATORVASTATIN CALCIUM 80 MG PO TABS: 80.0000 mg | ORAL_TABLET | Freq: Every day | ORAL | Status: DC

## 2014-05-12 MED ORDER — ZOLPIDEM TARTRATE 10 MG PO TABS: 10.0000 mg | ORAL_TABLET | Freq: Every evening | ORAL | Status: DC | PRN

## 2014-05-12 MED ORDER — MELOXICAM 15 MG PO TABS: ORAL_TABLET | ORAL | Status: DC

## 2014-05-12 MED ORDER — CYCLOBENZAPRINE HCL 10 MG PO TABS: ORAL_TABLET | ORAL | Status: DC

## 2014-05-12 MED ORDER — ASPIRIN 325 MG PO TABS: 325.0000 mg | ORAL_TABLET | Freq: Every day | ORAL | Status: DC

## 2014-05-12 NOTE — Assessment & Plan Note (Addendum)
Symptoms do sound predominantly discogenic, worse on the left side but with some right side anterior thigh radicular symptoms. They have been present for greater than 6 weeks we are going to obtain an x-ray and an MRI. He will return to go over MRI results and for interventional planning. Adding meloxicam and cyclobenzaprine.

## 2014-05-12 NOTE — Progress Notes (Signed)
  Subjective:    CC: low back pain  HPI: For the past 6+ weeks Nathan Sampson has had bilateral low back pain, with some left-sided L5 radiculopathy versus S1 radiculopathy. Pain is moderate, persistent, he did home rehabilitation exercises , but unfortunately continues to have pain, worse with flexion and Valsalva.  Hypertension: Needs a refill on medication.   Hyperlipidemia: Needs a refill on medication.   Insomnia: Needs a refill on Ambien.  Travel medicine: Needs a refill on Cipro  Past medical history, Surgical history, Family history not pertinant except as noted below, Social history, Allergies, and medications have been entered into the medical record, reviewed, and no changes needed.   Review of Systems: No fevers, chills, night sweats, weight loss, chest pain, or shortness of breath.   Objective:    General: Well Developed, well nourished, and in no acute distress.  Neuro: Alert and oriented x3, extra-ocular muscles intact, sensation grossly intact.  HEENT: Normocephalic, atraumatic, pupils equal round reactive to light, neck supple, no masses, no lymphadenopathy, thyroid nonpalpable.  Skin: Warm and dry, no rashes. Cardiac: Regular rate and rhythm, no murmurs rubs or gallops, no lower extremity edema.  Respiratory: Clear to auscultation bilaterally. Not using accessory muscles, speaking in full sentences. Back Exam:  Inspection: Unremarkable  Motion: Flexion 45 deg, Extension 45 deg, Side Bending to 45 deg bilaterally,  Rotation to 45 deg bilaterally  SLR laying: Negative  XSLR laying: Negative  Palpable tenderness: None. FABER: negative. Sensory change: Gross sensation intact to all lumbar and sacral dermatomes.  Reflexes: 2+ at both patellar tendons, 2+ at achilles tendons, Babinski's downgoing.  Strength at foot  Plantar-flexion: 5/5 Dorsi-flexion: 5/5 Eversion: 5/5 Inversion: 5/5  Leg strength  Quad: 5/5 Hamstring: 5/5 Hip flexor: 5/5 Hip abductors: 5/5  Gait  unremarkable.  Impression and Recommendations:    I spent 40 minutes with this patient, greater than 50% was face-to-face time counseling regarding the above multiple diagnoses.

## 2014-05-12 NOTE — Telephone Encounter (Signed)
MRI approval [id number] expires 06/10/14. Radiology notified. Margette Fast, CMA

## 2014-05-12 NOTE — Assessment & Plan Note (Signed)
Patient is traveling [place], adding a short course of Cipro for use as needed.

## 2014-05-12 NOTE — Assessment & Plan Note (Signed)
Refilling medication

## 2014-05-12 NOTE — Assessment & Plan Note (Signed)
Refilling Ambien. ?

## 2014-05-16 ENCOUNTER — Ambulatory Visit (HOSPITAL_BASED_OUTPATIENT_CLINIC_OR_DEPARTMENT_OTHER)
Admission: RE | Admit: 2014-05-16 | Discharge: 2014-05-16 | Disposition: A | Discharge status: Home / Self Care | Payer: BC Managed Care – PPO | Source: Ambulatory Visit | Attending: Sports Medicine | Admitting: Sports Medicine

## 2014-05-16 DIAGNOSIS — M5126 Other intervertebral disc displacement, lumbar region: Secondary | ICD-10-CM | POA: Diagnosis not present

## 2014-05-16 DIAGNOSIS — M4806 Spinal stenosis, lumbar region: Secondary | ICD-10-CM | POA: Diagnosis not present

## 2014-05-16 DIAGNOSIS — M545 Low back pain, unspecified: Secondary | ICD-10-CM

## 2014-05-16 DIAGNOSIS — M479 Spondylosis, unspecified: Secondary | ICD-10-CM | POA: Insufficient documentation

## 2014-05-22 ENCOUNTER — Ambulatory Visit: Payer: BC Managed Care – PPO | Admitting: Sports Medicine

## 2014-06-04 ENCOUNTER — Ambulatory Visit (INDEPENDENT_AMBULATORY_CARE_PROVIDER_SITE_OTHER): Payer: BC Managed Care – PPO | Admitting: Sports Medicine

## 2014-06-04 ENCOUNTER — Encounter: Payer: Self-pay | Admitting: Sports Medicine

## 2014-06-04 DIAGNOSIS — M5416 Radiculopathy, lumbar region: Secondary | ICD-10-CM | POA: Diagnosis not present

## 2014-06-04 DIAGNOSIS — I10 Essential (primary) hypertension: Secondary | ICD-10-CM

## 2014-06-04 MED ORDER — RAMIPRIL 5 MG PO CAPS: 5.0000 mg | ORAL_CAPSULE | Freq: Two times a day (BID) | ORAL | Status: DC

## 2014-06-04 NOTE — Patient Instructions (Signed)
Barotitis Media Barotitis media is inflammation of your middle ear. This occurs when the auditory tube (eustachian tube) leading from the back of your nose (nasopharynx) to your eardrum is blocked. This blockage may result from a cold, environmental allergies, or an upper respiratory infection. Unresolved barotitis media may lead to damage or hearing loss (barotrauma), which may become permanent. HOME CARE INSTRUCTIONS   Use medicines as recommended by your health care provider. Over-the-counter medicines will help unblock the canal and can help during times of air travel.  Do not put anything into your ears to clean or unplug them. Eardrops will not be helpful.  Do not swim, dive, or fly until your health care provider says it is all right to do so. If these activities are necessary, chewing gum with frequent, forceful swallowing may help. It is also helpful to hold your nose and gently blow to pop your ears for equalizing pressure changes. This forces air into the eustachian tube.  Only take over-the-counter or prescription medicines for pain, discomfort, or fever as directed by your health care provider.  A decongestant may be helpful in decongesting the middle ear and make pressure equalization easier. SEEK MEDICAL CARE IF:  You experience a serious form of dizziness in which you feel as if the room is spinning and you feel nauseated (vertigo).  Your symptoms only involve one ear. SEEK IMMEDIATE MEDICAL CARE IF:   You develop a severe headache, dizziness, or severe ear pain.  You have bloody or pus-like drainage from your ears.  You develop a fever.  Your problems do not improve or become worse. MAKE SURE YOU:   Understand these instructions.  Will watch your condition.  Will get help right away if you are not doing well or get worse. Document Released: 05/19/2000 Document Revised: 03/12/2013 Document Reviewed: 12/17/2012 ExitCare Patient Information 2015 [employer]. This  information is not intended to replace advice given to you by your health care provider. Make sure you discuss any questions you have with your health care provider.  

## 2014-06-04 NOTE — Progress Notes (Signed)
  Subjective:    CC: MRI results  HPI: Nathan Sampson returns for MRI results, he is having right-sided radiculopathy, tells me that he gets a sensation of heaviness and atrophy into his mid anterior thigh. Occasionally goes down to the outside of his right foot. When he shifts his weight to the left side he gets a stabbing pain in the left side of his mid back without radicular symptoms.  Ear fullness: Recently had a trip out of the country, since then has had difficulty popping his ear and has a sensation of fullness. No changes in hearing.  Past medical history, Surgical history, Family history not pertinant except as noted below, Social history, Allergies, and medications have been entered into the medical record, reviewed, and no changes needed.   Review of Systems: No fevers, chills, night sweats, weight loss, chest pain, or shortness of breath.   Objective:    General: Well Developed, well nourished, and in no acute distress.  Neuro: Alert and oriented x3, extra-ocular muscles intact, sensation grossly intact.  HEENT: Normocephalic, atraumatic, pupils equal round reactive to light, neck supple, no masses, no lymphadenopathy, thyroid nonpalpable. Both ear canals show middle ear effusion.  Skin: Warm and dry, no rashes. Cardiac: Regular rate and rhythm, no murmurs rubs or gallops, no lower extremity edema.  Respiratory: Clear to auscultation bilaterally. Not using accessory muscles, speaking in full sentences.  MRI shows multilevel degenerative disc disease from L2-L5. At the L3-L4 level and does appear as though the discomfort into the contact with the exiting L3 nerve root. He did have some symptoms in the lateral aspect of his right foot however we followed the entire course of the S1 nerve root and appears unimpeded.  Impression and Recommendations:

## 2014-06-04 NOTE — Assessment & Plan Note (Signed)
Refilling Ramapril

## 2014-06-04 NOTE — Assessment & Plan Note (Addendum)
Marl does have multilevel disc protrusions and multilevel facet arthritis. His right-sided symptoms are predominantly upper thigh and midnight, this likely represents a right-sided L3 radiculopathy. On review of his MRI he does have a right-sided L3-L4 disc protrusion but doesn't appear to come in contact with the exiting right L3 nerve root.  We are going to proceed with a right-sided L3-L4 transforaminal epidural for diagnostic and therapeutic purposes. He also endorses a stabbing pain on the left side when shifting his weight, this does sound like facet mediated pain versus his sacroiliac joint. He did have some symptoms in the lateral aspect of his right foot however we followed the entire course of the right S1 nerve root and appears unimpeded. Follow-up after his epidural and like to see him back to evaluate his residual symptoms. This may mean a subsequent left-sided facet intervention.

## 2014-06-15 ENCOUNTER — Ambulatory Visit
Admission: RE | Admit: 2014-06-15 | Discharge: 2014-06-15 | Disposition: A | Discharge status: Home / Self Care | Payer: BLUE CROSS/BLUE SHIELD | Source: Ambulatory Visit | Attending: Sports Medicine | Admitting: Sports Medicine

## 2014-06-15 ENCOUNTER — Telehealth: Payer: Self-pay

## 2014-06-15 DIAGNOSIS — M5416 Radiculopathy, lumbar region: Secondary | ICD-10-CM

## 2014-06-15 MED ORDER — METHYLPREDNISOLONE ACETATE 40 MG/ML INJ SUSP (RADIOLOG
120.0000 mg | Freq: Once | INTRAMUSCULAR | Status: AC
  Administered 2014-06-15: 120 mg via EPIDURAL

## 2014-06-15 MED ORDER — IOHEXOL 180 MG/ML  SOLN
1.0000 mL | Freq: Once | INTRAMUSCULAR | Status: AC | PRN
  Administered 2014-06-15: 1 mL via EPIDURAL

## 2014-06-15 NOTE — Discharge Instructions (Signed)

## 2014-06-15 NOTE — Telephone Encounter (Signed)
Order entered for right sided epidural.  He has already had this done.

## 2014-06-15 NOTE — Telephone Encounter (Signed)
Danyelle from GI called stated that a new order need to be placed for right side injection. Please advise that patient already had the right side done today the order just needs to be in the system. Rhonda Cunningham,CMA

## 2014-06-16 NOTE — Telephone Encounter (Signed)
Left message on Danyelle vm advising that order was put in for right side. Azael Ragain,CMA

## 2014-08-05 ENCOUNTER — Other Ambulatory Visit: Payer: Self-pay | Admitting: Sports Medicine

## 2014-08-07 ENCOUNTER — Other Ambulatory Visit: Payer: Self-pay | Admitting: Sports Medicine

## 2014-08-28 ENCOUNTER — Ambulatory Visit: Payer: BC Managed Care – PPO | Admitting: Cardiology

## 2014-09-02 ENCOUNTER — Encounter: Payer: Self-pay | Admitting: Cardiology

## 2014-09-02 ENCOUNTER — Ambulatory Visit (INDEPENDENT_AMBULATORY_CARE_PROVIDER_SITE_OTHER): Payer: BLUE CROSS/BLUE SHIELD | Admitting: Cardiology

## 2014-09-02 VITALS — BP 130/84 | HR 63 | Ht 70.0 in | Wt 233.1 lb

## 2014-09-02 DIAGNOSIS — F988 Other specified behavioral and emotional disorders with onset usually occurring in childhood and adolescence: Secondary | ICD-10-CM | POA: Insufficient documentation

## 2014-09-02 DIAGNOSIS — E785 Hyperlipidemia, unspecified: Secondary | ICD-10-CM

## 2014-09-02 DIAGNOSIS — I2510 Atherosclerotic heart disease of native coronary artery without angina pectoris: Secondary | ICD-10-CM

## 2014-09-02 DIAGNOSIS — I10 Essential (primary) hypertension: Secondary | ICD-10-CM

## 2014-09-02 DIAGNOSIS — R0600 Dyspnea, unspecified: Secondary | ICD-10-CM | POA: Insufficient documentation

## 2014-09-02 NOTE — Patient Instructions (Signed)
We will schedule you for a stress test  Continue your current medication
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
## 2014-09-13 ENCOUNTER — Other Ambulatory Visit: Payer: Self-pay | Admitting: Sports Medicine

## 2014-09-25 ENCOUNTER — Telehealth (HOSPITAL_COMMUNITY): Payer: Self-pay

## 2014-09-25 NOTE — Telephone Encounter (Signed)
Encounter complete. 

## 2014-09-30 ENCOUNTER — Ambulatory Visit (HOSPITAL_COMMUNITY)
Admission: RE | Admit: 2014-09-30 | Discharge: 2014-09-30 | Disposition: A | Discharge status: Home / Self Care | Payer: BLUE CROSS/BLUE SHIELD | Source: Ambulatory Visit | Attending: Cardiology | Admitting: Cardiology

## 2014-09-30 DIAGNOSIS — E785 Hyperlipidemia, unspecified: Secondary | ICD-10-CM

## 2014-09-30 DIAGNOSIS — R0600 Dyspnea, unspecified: Secondary | ICD-10-CM

## 2014-09-30 DIAGNOSIS — I2510 Atherosclerotic heart disease of native coronary artery without angina pectoris: Secondary | ICD-10-CM

## 2014-09-30 DIAGNOSIS — I10 Essential (primary) hypertension: Secondary | ICD-10-CM

## 2014-09-30 NOTE — Procedures (Signed)
Exercise Treadmill Test   Test  Exercise Tolerance Test Ordering MD: Peter Martinique, MD  Interpreting MD:   Unique Test No: 1 Treadmill:  1  Indication for ETT: exertional dyspnea  Contraindication to ETT: No   Stress Modality: exercise - treadmill  Cardiac Imaging Performed: non   Protocol: standard Bruce - maximal  Max BP:  167/103  Max MPHR (bpm):  160 85% MPR (bpm):  136  MPHR obtained (bpm):  179 % MPHR obtained:  111  Reached 85% MPHR (min:sec):  6:40 Total Exercise Time (min-sec):  10:21  Workload in METS:  12.30 Borg Scale:   Reason ETT Terminated:  fatigue    ST Segment Analysis At Rest: NSR, cannot R/O prior septal MI With Exercise: no evidence of significant ST depression  Other Information Arrhythmia:  Occasional PVC. Angina during ETT:  absent (0) Quality of ETT:  diagnostic  ETT Interpretation:  normal - no evidence of ischemia by ST analysis  Comments: ETT with good exercise tolerance (10:21); no chest pain; normal BP response; no ST changes; negative adequate ETT.  Kirk Ruths

## 2014-10-05 LAB — EXERCISE TOLERANCE TEST
CHL CUP STRESS STAGE 1 HR: 71 {beats}/min
CHL CUP STRESS STAGE 2 GRADE: 0 %
CHL CUP STRESS STAGE 2 HR: 72 {beats}/min
CHL CUP STRESS STAGE 2 SPEED: 1 mph
CHL CUP STRESS STAGE 4 DBP: 60 mmHg
CHL CUP STRESS STAGE 4 GRADE: 10 %
CHL CUP STRESS STAGE 4 HR: 107 {beats}/min
CHL CUP STRESS STAGE 5 DBP: 68 mmHg
CHL CUP STRESS STAGE 5 GRADE: 12 %
CHL CUP STRESS STAGE 5 HR: 127 {beats}/min
CHL CUP STRESS STAGE 5 SBP: 122 mmHg
CHL CUP STRESS STAGE 6 GRADE: 14 %
CHL CUP STRESS STAGE 6 SPEED: 3.4 mph
CHL CUP STRESS STAGE 7 SPEED: 4.2 mph
CHL CUP STRESS STAGE 8 DBP: 83 mmHg
CHL CUP STRESS STAGE 8 GRADE: 0 %
CHL CUP STRESS STAGE 8 HR: 144 {beats}/min
CHL CUP STRESS STAGE 8 SBP: 130 mmHg
CHL CUP STRESS STAGE 8 SPEED: 0 mph
CHL CUP STRESS STAGE 9 DBP: 79 mmHg
CHL CUP STRESS STAGE 9 SBP: 130 mmHg
Estimated workload: 12.3 METS
Peak HR: 176 {beats}/min
Percent of predicted max HR: 110 %
Stage 1 DBP: 74 mmHg
Stage 1 Grade: 0 %
Stage 1 SBP: 114 mmHg
Stage 1 Speed: 0 mph
Stage 3 Grade: 0.1 %
Stage 3 HR: 72 {beats}/min
Stage 3 Speed: 1 mph
Stage 4 SBP: 131 mmHg
Stage 4 Speed: 1.7 mph
Stage 5 Speed: 2.5 mph
Stage 6 HR: 157 {beats}/min
Stage 7 Grade: 16 %
Stage 7 HR: 176 {beats}/min
Stage 9 Grade: 0 %
Stage 9 HR: 98 {beats}/min
Stage 9 Speed: 0 mph

## 2014-11-25 ENCOUNTER — Emergency Department (INDEPENDENT_AMBULATORY_CARE_PROVIDER_SITE_OTHER)
Admission: EM | Admit: 2014-11-25 | Discharge: 2014-11-25 | Disposition: A | Discharge status: Home / Self Care | Payer: BLUE CROSS/BLUE SHIELD | Source: Home / Self Care | Attending: Emergency Medicine | Admitting: Emergency Medicine

## 2014-11-25 ENCOUNTER — Encounter: Payer: Self-pay | Admitting: Emergency Medicine

## 2014-11-25 ENCOUNTER — Emergency Department (INDEPENDENT_AMBULATORY_CARE_PROVIDER_SITE_OTHER): Payer: BLUE CROSS/BLUE SHIELD

## 2014-11-25 DIAGNOSIS — M25551 Pain in right hip: Secondary | ICD-10-CM | POA: Diagnosis not present

## 2014-11-25 DIAGNOSIS — M25572 Pain in left ankle and joints of left foot: Secondary | ICD-10-CM

## 2014-11-25 DIAGNOSIS — R52 Pain, unspecified: Secondary | ICD-10-CM

## 2014-11-25 MED ORDER — HYDROCODONE-ACETAMINOPHEN 7.5-325 MG PO TABS: 1.0000 | ORAL_TABLET | Freq: Three times a day (TID) | ORAL | Status: DC | PRN
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
## 2014-11-25 NOTE — Discharge Instructions (Signed)
Osteoarthritis °Osteoarthritis is a disease that causes soreness and inflammation of a joint. It occurs when the cartilage at the affected joint wears down. Cartilage acts as a cushion, covering the ends of bones where they meet to form a joint. Osteoarthritis is the most common form of arthritis. It often occurs in older people. The joints affected most often by this condition include those in the: °· Ends of the fingers. °· Thumbs. °· Neck. °· Lower back. °· Knees. °· Hips. °CAUSES  °Over time, the cartilage that covers the ends of bones begins to wear away. This causes bone to rub on bone, producing pain and stiffness in the affected joints.  °RISK FACTORS °Certain factors can increase your chances of having osteoarthritis, including: °· Older age. °· Excessive body weight. °· Overuse of joints. °· Previous joint injury. °SIGNS AND SYMPTOMS  °· Pain, swelling, and stiffness in the joint. °· Over time, the joint may lose its normal shape. °· Small deposits of bone (osteophytes) may grow on the edges of the joint. °· Bits of bone or cartilage can break off and float inside the joint space. This may cause more pain and damage. °DIAGNOSIS  °Your health care provider will do a physical exam and ask about your symptoms. Various tests may be ordered, such as: °· X-rays of the affected joint. °· An MRI scan. °· Blood tests to rule out other types of arthritis. °· Joint fluid tests. This involves using a needle to draw fluid from the joint and examining the fluid under a microscope. °TREATMENT  °Goals of treatment are to control pain and improve joint function. Treatment plans may include: °· A prescribed exercise program that allows for rest and joint relief. °· A weight control plan. °· Pain relief techniques, such as: °¨ Properly applied heat and cold. °¨ Electric pulses delivered to nerve endings under the skin (transcutaneous electrical nerve stimulation [TENS]). °¨ Massage. °¨ Certain nutritional  supplements. °· Medicines to control pain, such as: °¨ Acetaminophen. °¨ Nonsteroidal anti-inflammatory drugs (NSAIDs), such as naproxen. °¨ Narcotic or central-acting agents, such as tramadol. °¨ Corticosteroids. These can be given orally or as an injection. °· Surgery to reposition the bones and relieve pain (osteotomy) or to remove loose pieces of bone and cartilage. Joint replacement may be needed in advanced states of osteoarthritis. °HOME CARE INSTRUCTIONS  °· Take medicines only as directed by your health care provider. °· Maintain a healthy weight. Follow your health care provider's instructions for weight control. This may include dietary instructions. °· Exercise as directed. Your health care provider can recommend specific types of exercise. These may include: °¨ Strengthening exercises. These are done to strengthen the muscles that support joints affected by arthritis. They can be performed with weights or with exercise bands to add resistance. °¨ Aerobic activities. These are exercises, such as brisk walking or low-impact aerobics, that get your heart pumping. °¨ Range-of-motion activities. These keep your joints limber. °¨ Balance and agility exercises. These help you maintain daily living skills. °· Rest your affected joints as directed by your health care provider. °· Keep all follow-up visits as directed by your health care provider. °SEEK MEDICAL CARE IF:  °· Your skin turns red. °· You develop a rash in addition to your joint pain. °· You have worsening joint pain. °· You have a fever along with joint or muscle aches. °SEEK IMMEDIATE MEDICAL CARE IF: °· You have a significant loss of weight or appetite. °· You have night sweats. °FOR MORE   [place] of Arthritis and Musculoskeletal and Skin Diseases: www.niams.SouthExposed.es  [employer] on Aging: http://kim-miller.com/  American College of Rheumatology: www.rheumatology.org Document Released: 05/22/2005 Document Revised:  10/06/2013 Document Reviewed: 01/27/2013 [facility] Patient Information 2015 [place], [place]. This information is not intended to replace advice given to you by your health care provider. Make sure you discuss any questions you have with your health care provider. Hip Pain Your hip is the joint between your upper legs and your lower pelvis. The bones, cartilage, tendons, and muscles of your hip joint perform a lot of work each day supporting your body weight and allowing you to move around. Hip pain can range from a minor ache to severe pain in one or both of your hips. Pain may be felt on the inside of the hip joint near the groin, or the outside near the buttocks and upper thigh. You may have swelling or stiffness as well.  HOME CARE INSTRUCTIONS   Take medicines only as directed by your health care provider.  Apply ice to the injured area:  Put ice in a plastic bag.  Place a towel between your skin and the bag.  Leave the ice on for 15-20 minutes at a time, 3-4 times a day.  Keep your leg raised (elevated) when possible to lessen swelling.  Avoid activities that cause pain.  Follow specific exercises as directed by your health care provider.  Sleep with a pillow between your legs on your most comfortable side.  Record how often you have hip pain, the location of the pain, and what it feels like. SEEK MEDICAL CARE IF:   You are unable to put weight on your leg.  Your hip is red or swollen or very tender to touch.  Your pain or swelling continues or worsens after 1 week.  You have increasing difficulty walking.  You have a fever. SEEK IMMEDIATE MEDICAL CARE IF:   You have fallen.  You have a sudden increase in pain and swelling in your hip. MAKE SURE YOU:   Understand these instructions.  Will watch your condition.  Will get help right away if you are not doing well or get worse. Document Released: 11/09/2009 Document Revised: 10/06/2013 Document Reviewed:  01/16/2013 [facility] Patient Information 2015 [place], [place]. This information is not intended to replace advice given to you by your health care provider. Make sure you discuss any questions you have with your health care provider.
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
## 2014-11-28 ENCOUNTER — Telehealth: Payer: Self-pay | Admitting: Emergency Medicine

## 2014-12-14 ENCOUNTER — Ambulatory Visit (INDEPENDENT_AMBULATORY_CARE_PROVIDER_SITE_OTHER): Payer: BLUE CROSS/BLUE SHIELD | Admitting: Sports Medicine

## 2014-12-14 ENCOUNTER — Ambulatory Visit (INDEPENDENT_AMBULATORY_CARE_PROVIDER_SITE_OTHER): Payer: BLUE CROSS/BLUE SHIELD

## 2014-12-14 ENCOUNTER — Encounter: Payer: Self-pay | Admitting: Sports Medicine

## 2014-12-14 VITALS — BP 129/76 | HR 62 | Ht 70.0 in | Wt 230.0 lb

## 2014-12-14 DIAGNOSIS — M791 Myalgia: Secondary | ICD-10-CM | POA: Diagnosis not present

## 2014-12-14 DIAGNOSIS — M25572 Pain in left ankle and joints of left foot: Secondary | ICD-10-CM

## 2014-12-14 DIAGNOSIS — M1611 Unilateral primary osteoarthritis, right hip: Secondary | ICD-10-CM

## 2014-12-14 DIAGNOSIS — M47896 Other spondylosis, lumbar region: Secondary | ICD-10-CM | POA: Diagnosis not present

## 2014-12-14 DIAGNOSIS — H538 Other visual disturbances: Secondary | ICD-10-CM | POA: Diagnosis not present

## 2014-12-14 DIAGNOSIS — M609 Myositis, unspecified: Secondary | ICD-10-CM

## 2014-12-14 DIAGNOSIS — M7751 Other enthesopathy of right foot: Secondary | ICD-10-CM | POA: Diagnosis not present

## 2014-12-14 DIAGNOSIS — IMO0001 Reserved for inherently not codable concepts without codable children: Secondary | ICD-10-CM

## 2014-12-14 DIAGNOSIS — M5416 Radiculopathy, lumbar region: Secondary | ICD-10-CM

## 2014-12-14 MED ORDER — HYDROCODONE-ACETAMINOPHEN 7.5-325 MG PO TABS: 1.0000 | ORAL_TABLET | Freq: Three times a day (TID) | ORAL | Status: DC | PRN

## 2014-12-14 NOTE — Assessment & Plan Note (Signed)
Continues to complain of occasional issues with peripheral vision in the right eye. He has seen an ophthalmologic in the past, vision seems normal currently, I have asked him to revisit his ophthalmologist at the first sign that it worsens. He is not complaining of bilateral peripheral vision loss and he does not have any headaches.
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
## 2014-12-14 NOTE — Assessment & Plan Note (Signed)
Heel lift, continue meloxicam, avoid stretching for now.

## 2014-12-14 NOTE — Assessment & Plan Note (Signed)
Hip joint injection as above.
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
## 2014-12-29 ENCOUNTER — Ambulatory Visit (INDEPENDENT_AMBULATORY_CARE_PROVIDER_SITE_OTHER): Payer: BLUE CROSS/BLUE SHIELD | Admitting: Rehabilitative and Restorative Service Providers"

## 2014-12-29 ENCOUNTER — Encounter: Payer: Self-pay | Admitting: Rehabilitative and Restorative Service Providers"

## 2014-12-29 DIAGNOSIS — Z7409 Other reduced mobility: Secondary | ICD-10-CM

## 2014-12-29 DIAGNOSIS — M25551 Pain in right hip: Secondary | ICD-10-CM | POA: Diagnosis not present

## 2014-12-29 DIAGNOSIS — M545 Low back pain, unspecified: Secondary | ICD-10-CM

## 2014-12-29 DIAGNOSIS — M623 Immobility syndrome (paraplegic): Secondary | ICD-10-CM

## 2014-12-29 DIAGNOSIS — M2560 Stiffness of unspecified joint, not elsewhere classified: Secondary | ICD-10-CM

## 2014-12-29 NOTE — Patient Instructions (Signed)
Trunk: Prone Extension (Press-Ups) No Pain!   Lie on stomach on firm, flat surface. Relax bottom and legs. Raise chest in air with elbows straight. Keep hips flat on surface, sag stomach. Hold __2-3__ seconds. Repeat _10__ times. Do __2-3__ sessions per day. CAUTION: Movement should be gentle and slow.  Cat / Cow Flow   Inhale, press spine toward ceiling like a Halloween cat. Keeping strength in arms and abdominals, exhale to soften spine through neutral and into cow pose. Open chest and arch back. Initiate movement between cat and cow at tailbone, one vertebrae at a time. Repeat _10___ times.  Hamstring Step 1   Straighten left knee. Opposite knee bent.. Hold _30__ seconds. 3 reps. Relax knee by returning foot to start. Repeat ___ times.   Abdominal Bracing With Pelvic Floor (Hook-Lying)   With neutral spine, tighten pelvic floor and abdominals, tighten low back muscles at waist. 10 sec hold Repeat __10_ times. Do _several__ times a day.
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
## 2014-12-31 ENCOUNTER — Encounter: Payer: Self-pay | Admitting: Rehabilitative and Restorative Service Providers"

## 2014-12-31 ENCOUNTER — Ambulatory Visit (INDEPENDENT_AMBULATORY_CARE_PROVIDER_SITE_OTHER): Payer: BLUE CROSS/BLUE SHIELD | Admitting: Rehabilitative and Restorative Service Providers"

## 2014-12-31 DIAGNOSIS — M623 Immobility syndrome (paraplegic): Secondary | ICD-10-CM

## 2014-12-31 DIAGNOSIS — Z7409 Other reduced mobility: Secondary | ICD-10-CM

## 2014-12-31 DIAGNOSIS — M545 Low back pain, unspecified: Secondary | ICD-10-CM

## 2014-12-31 DIAGNOSIS — M25551 Pain in right hip: Secondary | ICD-10-CM

## 2014-12-31 DIAGNOSIS — M2560 Stiffness of unspecified joint, not elsewhere classified: Secondary | ICD-10-CM

## 2014-12-31 NOTE — Therapy (Signed)
[place], [place], [address] Phone: [phone]   Fax:  [phone]  Physical Therapy Treatment  Patient Details  Name: Nathan Sampson MRN: [id number] Date of Birth: 1956/04/11 Referring Provider:  Silverio Decamp,*  Encounter Date: 12/31/2014      PT End of Session - 12/31/14 1146    Visit Number 2   Number of Visits 16   Date for PT Re-Evaluation 02/16/15   PT Start Time 0928   PT Stop Time 1030   PT Time Calculation (min) 62 min   Activity Tolerance Patient tolerated treatment well;No increased pain  decreased pain post treatment 3/10 LB only      Past Medical History  Diagnosis Date  . Coronary artery disease     Status post stenting of the mid LAD and distal right coronary with drug-eluting stents in July 2009.  He is Asymptomatic. He had a strss echo in September 2009 which was normal.  . Dyslipidemia   . Hypertension     Poorly Controlled. Exacerbated by sedentary lifestyle, diet and weight gain, and stress.  . OCD (obsessive compulsive disorder)     Past Surgical History  Procedure Laterality Date  . Arthroscopic repair acl    . Cardiovascular stress test  11-29-2009    EF 57%  . Hernia repair      AGE 3 - HERNIA REPAIR  . Surgery for sleep apnea      ABOUT 10 YRS AGO-? PALATOPLASTY AND T/A-SURGERY WAS DONE IN [place]    There were no vitals filed for this visit.  Visit Diagnosis:  Bilateral low back pain without sciatica  Pain, hip, right  Stiffness due to immobility  Impaired functional mobility and endurance      Subjective Assessment - 12/31/14 0938    Subjective Gopal reports that his pain has not changed significantly. He has been doing his exercises. Did have some increased pain in the LB yesterday while picking blueberries. He reports decreased pain with manual work/estim with treatment today.   Pertinent History LBP 2015 with radicular pain into  Rt LE, resolved with injection; arthritis Rt hip with injection 2015; rotator cuff Lt 2014; Rt knee meniscus surgery 2019; heart surgery 2009 2 stents   Currently in Pain? Yes   Pain Score 3    Pain Location Back   Pain Orientation Left;Mid   Pain Descriptors / Indicators Sore;Aching   Pain Type Acute pain   Pain Onset More than a month ago   Pain Frequency Intermittent   Pain Score 4   Pain Location Hip   Pain Orientation Right   Pain Descriptors / Indicators Aching   Pain Frequency Intermittent           [facility] Adult PT Treatment/Exercise - 12/31/14 0001    Lumbar Exercises: Stretches   Passive Hamstring Stretch 3 reps;30 seconds   Single Knee to Chest Stretch 5 reps   Single Knee to Chest Stretch Limitations each LE 5 sec hold   Press Ups --  10 reps 2-3 sec hold   Lumbar Exercises: Supine   Ab Set 10 reps  10 sec hold   Lumbar Exercises: Quadruped   Madcat/Old Horse 10 reps   Cryotherapy   Number Minutes Cryotherapy 18 Minutes   Cryotherapy Location Lumbar Spine;Hip   Type of Cryotherapy Ice pack   Electrical Stimulation   Electrical Stimulation Location Lt LB/Rt piriformis   Electrical Stimulation Action IFC   Electrical Stimulation  Parameters to tolerance   Electrical Stimulation Goals Pain;Tone   Manual Therapy   Soft tissue mobilization Lt QL/LB; Rt piriformis/hip abductors   Manual Traction through Rt LE - traction through foot/leg extended 30 sec hold 3-4 reps             PT Education - 12/31/14 0951    Education provided Yes   Education Details HEP    Person(s) Educated Patient   Methods Explanation;Demonstration;Tactile cues;Verbal cues;Handout   Comprehension Verbalized understanding;Returned demonstration;Verbal cues required;Tactile cues required          PT Short Term Goals - 12/31/14 1301    PT SHORT TERM GOAL #1   Title Patient I in initial HEP 01/26/15   Time 4   Period Weeks   Status On-going   PT SHORT TERM GOAL #2   Title  Improve HS flexibility to 75 degrees Rt and 80 degrees Lt with supine testing 01/26/15   Time 4   Period Weeks   Status On-going   PT SHORT TERM GOAL #3   Title Increase trunk extension to 75- 85% prone    Time 4   Period Weeks   Status On-going   PT SHORT TERM GOAL #4   Title Decrease pain to 2-3/10 50-75% of day   Time 4   Period Weeks   Status On-going           PT Long Term Goals - 12/31/14 1301    PT LONG TERM GOAL #1   Title Patient I with HEP for discharge 02/16/15   Time 8   Period Weeks   Status On-going   PT LONG TERM GOAL #2   Title Improve sitting time to 1 hour with min to no pain 02/16/15   Time 8   Period Weeks   Status On-going   PT LONG TERM GOAL #3   Title Improve standing time to 30-45 min with min to no pain 02/16/15   Time 8   Period Weeks   Status On-going   PT LONG TERM GOAL #4   Title Increase activity level allowing patient to return to wood turning without pain 02/16/15   Time 8   Period Weeks   Status On-going            Plan - 12/31/14 1148    Clinical Impression Statement Continued pain - no significant change. Increased pain with picking blueberries yesterday; discussed position for back with functional activities - to avoid bending/lifting/prolonged postures. Good response to manual work and e-stim. Continue evaluation and treatment, including focus on muscular tightness. Encouraged patient to purchase TENS unit for home. He will be travelling for the next week or so. Continue therapy as scheduled.   Pt will benefit from skilled therapeutic intervention in order to improve on the following deficits Pain;Decreased range of motion;Improper body mechanics;Postural dysfunction;Abnormal gait;Decreased activity tolerance   Rehab Potential Good   PT Frequency 2x / week   PT Duration 8 weeks   PT Treatment/Interventions Patient/family education;ADLs/Self Care Home Management;Therapeutic exercise;Therapeutic activities;Neuromuscular  re-education;Cryotherapy;Electrical Stimulation;Iontophoresis 4mg /ml Dexamethasone;Moist Heat;Traction;Ultrasound;Manual techniques   PT Next Visit Plan Review HEP; progress with piriformis stretch; continue with stabilization/exercise as indicated; manual work   PT Home Exercise Plan HEP, body mechanics; purchase TENS unit for apin management   Consulted and Agree with Plan of Care Patient        Problem List Patient Active Problem List   Diagnosis Date Noted  . HLD (hyperlipidemia) 09/02/2014  . ADD (attention deficit  disorder) 09/02/2014  . Dyspnea 09/02/2014  . Right lumbar radiculopathy 05/12/2014  . Travel advice encounter 05/12/2014  . Blurry vision, right eye 02/19/2014  . Male hypogonadism 02/05/2014  . Insomnia 02/03/2014  . Annual physical exam 02/03/2014  . Allergic rhinitis 02/03/2014  . Retrocalcaneal bursitis 01/20/2014  . Left ankle pain 01/20/2014  . Osteoarthritis of right hip 01/20/2014  . Coronary artery disease   . Dyslipidemia   . Hypertension     Yonael Tulloch Nilda Simmer, PT, MPH 12/31/2014, 1:03 PM  [facility] [place], [place], [address] Phone: [phone]   Fax:  [phone]

## 2014-12-31 NOTE — Patient Instructions (Addendum)
Abdominal Bracing With Pelvic Floor (Hook-Lying)   With neutral spine, tighten pelvic floor and abdominals, then tighten back muscles at waist. Repeat _10__ times. Hold 10 sec. Do __several _ times a day. Progress to do this exercise in sitting and standing - with functional activities.    Knee to Chest (Flexion)   Pull knee toward chest. Feel stretch in lower back or buttock area. Breathing deeply, Hold _5-10___ seconds. Repeat with other knee. Repeat __10__ times. Do _2-3 ___ sessions per day.   Lying on back - hips and knees bent  Rotate knees to one side pause 3-5 sec return to upright repeat on opposite side  Keep core tight throughout exercise. 5 reps each side.

## 2015-01-06 ENCOUNTER — Encounter: Payer: BLUE CROSS/BLUE SHIELD | Admitting: Rehabilitative and Restorative Service Providers"

## 2015-01-11 ENCOUNTER — Encounter: Payer: Self-pay | Admitting: Sports Medicine

## 2015-01-11 ENCOUNTER — Ambulatory Visit (INDEPENDENT_AMBULATORY_CARE_PROVIDER_SITE_OTHER): Payer: BLUE CROSS/BLUE SHIELD | Admitting: Sports Medicine

## 2015-01-11 ENCOUNTER — Ambulatory Visit (INDEPENDENT_AMBULATORY_CARE_PROVIDER_SITE_OTHER): Payer: BLUE CROSS/BLUE SHIELD | Admitting: Rehabilitative and Restorative Service Providers"

## 2015-01-11 VITALS — BP 134/91 | HR 58 | Temp 98.5°F | Wt 226.0 lb

## 2015-01-11 DIAGNOSIS — M1651 Unilateral post-traumatic osteoarthritis, right hip: Secondary | ICD-10-CM | POA: Diagnosis not present

## 2015-01-11 DIAGNOSIS — M7751 Other enthesopathy of right foot: Secondary | ICD-10-CM | POA: Diagnosis not present

## 2015-01-11 DIAGNOSIS — M2560 Stiffness of unspecified joint, not elsewhere classified: Secondary | ICD-10-CM

## 2015-01-11 DIAGNOSIS — M25551 Pain in right hip: Secondary | ICD-10-CM | POA: Diagnosis not present

## 2015-01-11 DIAGNOSIS — M5416 Radiculopathy, lumbar region: Secondary | ICD-10-CM

## 2015-01-11 DIAGNOSIS — Z7409 Other reduced mobility: Secondary | ICD-10-CM

## 2015-01-11 DIAGNOSIS — M545 Low back pain, unspecified: Secondary | ICD-10-CM

## 2015-01-11 DIAGNOSIS — M623 Immobility syndrome (paraplegic): Secondary | ICD-10-CM | POA: Diagnosis not present

## 2015-01-11 NOTE — Therapy (Signed)
[place], [place], [address] Phone: [phone]   Fax:  [phone]  Physical Therapy Treatment  Patient Details  Name: Nathan Sampson MRN: [id number] Date of Birth: 1956/04/21 Referring Provider:  Silverio Decamp,*  Encounter Date: 01/11/2015      PT End of Session - 01/11/15 0926    Visit Number 3   Number of Visits 16   Date for PT Re-Evaluation 02/16/15   PT Start Time 2951   PT Stop Time 0935   PT Time Calculation (min) 41 min   Activity Tolerance Patient tolerated treatment well;No increased pain      Past Medical History  Diagnosis Date  . Coronary artery disease     Status post stenting of the mid LAD and distal right coronary with drug-eluting stents in July 2009.  He is Asymptomatic. He had a strss echo in September 2009 which was normal.  . Dyslipidemia   . Hypertension     Poorly Controlled. Exacerbated by sedentary lifestyle, diet and weight gain, and stress.  . OCD (obsessive compulsive disorder)     Past Surgical History  Procedure Laterality Date  . Arthroscopic repair acl    . Cardiovascular stress test  11-29-2009    EF 57%  . Hernia repair      AGE 59 - HERNIA REPAIR  . Surgery for sleep apnea      ABOUT 10 YRS AGO-? PALATOPLASTY AND T/A-SURGERY WAS DONE IN [place]    There were no vitals filed for this visit.  Visit Diagnosis:  Bilateral low back pain without sciatica  Pain, hip, right  Stiffness due to immobility  Impaired functional mobility and endurance      Subjective Assessment - 01/11/15 0854    Subjective Nathan Sampson reports that he may be ~30% improved. He continues to have numbness in 2nd, 3rd, 4th toes which is now constant in nature. Nathan Sampson reports continued pain in the Rt hip and buttocks, increased with lying on Rt to sleep. He did fall in the shower when he was standing on Rt LE. no injury. He continues to have popping in the Rt hip wihich  has increased in frequency.  Manual work from last treatment was helpful and he continues to do exercises at home. He has done some lifting and bending, etc...   Currently in Pain? Yes   Pain Score 3    Pain Location Back   Pain Orientation Left;Mid   Pain Descriptors / Indicators Throbbing   Pain Type Acute pain   Pain Onset More than a month ago   Pain Frequency Intermittent   Pain Score 1  sleeping on Rt side increases to 7-8   Pain Location Hip   Pain Orientation Right   Pain Descriptors / Indicators Aching   Pain Type Acute pain   Pain Onset More than a month ago   Pain Frequency Intermittent            [facility] PT Assessment - 01/11/15 0001    AROM   Right/Left Hip --  Rt hamstring 82 degrees; hip flexion 107 degrees   Lumbar Flexion 80%   Lumbar Extension 60%   Lumbar - Right Side Bend 80%   Lumbar - Left Side Bend 80%    Palpation   SI assessment  Painful spring testing Rt SI   Palpation comment Tight/painful Rt hip flexors/piriformis/hip abductors. bilat QL Rt > Lt   FABER test   findings Negative  Side Right   Comment tight with pain in the Rt hip flexors/groin area   Prone Knee Bend Test   Findings Negative   Side Right   Comment tight knee flexion at 105 degLt/100 Rt   Straight Leg Raise   Findings Negative   Comment HS tightness bilat  - improving                      [facility] Adult PT Treatment/Exercise - 01/11/15 0001    Lumbar Exercises: Stretches   Passive Hamstring Stretch 3 reps;30 seconds   Single Knee to Chest Stretch 5 reps   Single Knee to Chest Stretch Limitations each LE 5 sec hold   Press Ups --  10 reps 2-3 sec hold   Lumbar Exercises: Supine   Ab Set 10 reps  10 sec hold   Lumbar Exercises: Quadruped   [place] 10 reps   Cryotherapy   Number Minutes Cryotherapy 15 Minutes   Cryotherapy Location Lumbar Spine   Type of Cryotherapy Ice pack   Electrical Stimulation   Electrical Stimulation Location Lt LB/Rt  piriformis   Electrical Stimulation Action IFC   Electrical Stimulation Parameters to tolerance   Electrical Stimulation Goals Pain   Manual Therapy   Joint Mobilization PA glides gradeII L5/L4/L3 and Rt SI joint    Soft tissue mobilization Lt QL/LB; Rt piriformis/hip abductors                PT Education - 01/11/15 0925    Education provided Yes   Education Details Avoid lifting; prolonged positions and be consistent with postural control and exercises   Person(s) Educated Patient   Methods Explanation   Comprehension Verbalized understanding          PT Short Term Goals - 12/31/14 1301    PT SHORT TERM GOAL #1   Title Patient I in initial HEP 01/26/15   Time 4   Period Weeks   Status On-going   PT SHORT TERM GOAL #2   Title Improve HS flexibility to 75 degrees Rt and 80 degrees Lt with supine testing 01/26/15   Time 4   Period Weeks   Status On-going   PT SHORT TERM GOAL #3   Title Increase trunk extension to 75- 85% prone    Time 4   Period Weeks   Status On-going   PT SHORT TERM GOAL #4   Title Decrease pain to 2-3/10 50-75% of day   Time 4   Period Weeks   Status On-going           PT Long Term Goals - 12/31/14 1301    PT LONG TERM GOAL #1   Title Patient I with HEP for discharge 02/16/15   Time 8   Period Weeks   Status On-going   PT LONG TERM GOAL #2   Title Improve sitting time to 1 hour with min to no pain 02/16/15   Time 8   Period Weeks   Status On-going   PT LONG TERM GOAL #3   Title Improve standing time to 30-45 min with min to no pain 02/16/15   Time 8   Period Weeks   Status On-going   PT LONG TERM GOAL #4   Title Increase activity level allowing patient to return to wood turning without pain 02/16/15   Time 8   Period Weeks   Status On-going  Plan - 01/11/15 3244    Clinical Impression Statement Nathan Sampson demonstrates some imrovement in trunk and LE ROM and has decreased tenderness and tightness to  palpation through the lumbar spine and Rt SI area although there continues to be pain with spring testing in lower lumbar and Rt SI. He has persistent pain in the back/hip and numbness in Rt toes. He has only been for 3 PT visits and would benefit from continued efforts in PT.    Pt will benefit from skilled therapeutic intervention in order to improve on the following deficits Pain;Decreased range of motion;Improper body mechanics;Postural dysfunction;Abnormal gait;Decreased activity tolerance   Rehab Potential Good   PT Frequency 2x / week   PT Treatment/Interventions Patient/family education;ADLs/Self Care Home Management;Therapeutic exercise;Therapeutic activities;Neuromuscular re-education;Cryotherapy;Electrical Stimulation;Iontophoresis 4mg /ml Dexamethasone;Moist Heat;Traction;Ultrasound;Manual techniques   PT Next Visit Plan Review HEP; progress with piriformis stretch; continue with stabilization/exercise as indicated; manual work   PT Home Exercise Plan HEP, body mechanics; purchase TENS unit for apin management   Consulted and Agree with Plan of Care Patient        Problem List Patient Active Problem List   Diagnosis Date Noted  . HLD (hyperlipidemia) 09/02/2014  . ADD (attention deficit disorder) 09/02/2014  . Dyspnea 09/02/2014  . Right lumbar radiculopathy 05/12/2014  . Travel advice encounter 05/12/2014  . Blurry vision, right eye 02/19/2014  . Male hypogonadism 02/05/2014  . Insomnia 02/03/2014  . Annual physical exam 02/03/2014  . Allergic rhinitis 02/03/2014  . Retrocalcaneal bursitis 01/20/2014  . Left ankle pain 01/20/2014  . Osteoarthritis of right hip 01/20/2014  . Coronary artery disease   . Dyslipidemia   . Hypertension     Emalea Mix Nilda Simmer, PT, MPH 01/11/2015, 9:32 AM  [facility] [place], [place], [address] Phone: [phone]   Fax:  [phone]
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
## 2015-01-11 NOTE — Assessment & Plan Note (Signed)
Fairly stable, he still does get some occasional clicking and catching of the hip that likely represents a labral injury. At this point he will do hip flexor rehabilitation exercises, and return to see me in a month, if persistent pain considering his hip osteoarthritis he would probably be more of a candidate for replacement rather than arthroscopy.
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
## 2015-01-13 ENCOUNTER — Encounter: Payer: Self-pay | Admitting: Rehabilitative and Restorative Service Providers"

## 2015-01-13 ENCOUNTER — Telehealth: Payer: Self-pay | Admitting: Sports Medicine

## 2015-01-13 ENCOUNTER — Ambulatory Visit (INDEPENDENT_AMBULATORY_CARE_PROVIDER_SITE_OTHER): Payer: BLUE CROSS/BLUE SHIELD | Admitting: Rehabilitative and Restorative Service Providers"

## 2015-01-13 DIAGNOSIS — M545 Low back pain, unspecified: Secondary | ICD-10-CM

## 2015-01-13 DIAGNOSIS — M623 Immobility syndrome (paraplegic): Secondary | ICD-10-CM

## 2015-01-13 DIAGNOSIS — M2560 Stiffness of unspecified joint, not elsewhere classified: Secondary | ICD-10-CM

## 2015-01-13 DIAGNOSIS — Z7409 Other reduced mobility: Secondary | ICD-10-CM

## 2015-01-13 DIAGNOSIS — M25572 Pain in left ankle and joints of left foot: Secondary | ICD-10-CM

## 2015-01-13 DIAGNOSIS — M25551 Pain in right hip: Secondary | ICD-10-CM | POA: Diagnosis not present

## 2015-01-13 MED ORDER — ZOLPIDEM TARTRATE 10 MG PO TABS: 10.0000 mg | ORAL_TABLET | Freq: Every evening | ORAL | Status: DC | PRN

## 2015-01-13 MED ORDER — HYDROCODONE-ACETAMINOPHEN 7.5-325 MG PO TABS: 1.0000 | ORAL_TABLET | Freq: Three times a day (TID) | ORAL | Status: DC | PRN

## 2015-01-13 NOTE — Telephone Encounter (Signed)
Patient came by today after PT and said that he has changed his mind about pain meds and would like a script for hydrocodone.  He will also need rx for ambien as well.  Can we please let him know when rx is ready and he will come by and pick it up?  thanks

## 2015-01-13 NOTE — Therapy (Signed)
[place], [place], [address] Phone: [phone]   Fax:  [phone]  Physical Therapy Treatment  Patient Details  Name: Nathan Sampson MRN: [id number] Date of Birth: 12/22/1955 Referring Provider:  Silverio Decamp,*  Encounter Date: 01/13/2015      PT End of Session - 01/13/15 0938    Visit Number 4   Number of Visits 16   Date for PT Re-Evaluation 02/16/15   PT Start Time 0845   PT Stop Time 0941   PT Time Calculation (min) 56 min   Activity Tolerance Patient tolerated treatment well;No increased pain      Past Medical History  Diagnosis Date  . Coronary artery disease     Status post stenting of the mid LAD and distal right coronary with drug-eluting stents in July 2009.  He is Asymptomatic. He had a strss echo in September 2009 which was normal.  . Dyslipidemia   . Hypertension     Poorly Controlled. Exacerbated by sedentary lifestyle, diet and weight gain, and stress.  . OCD (obsessive compulsive disorder)     Past Surgical History  Procedure Laterality Date  . Arthroscopic repair acl    . Cardiovascular stress test  11-29-2009    EF 57%  . Hernia repair      AGE 59 - HERNIA REPAIR  . Surgery for sleep apnea      ABOUT 10 YRS AGO-? PALATOPLASTY AND T/A-SURGERY WAS DONE IN [place]    There were no vitals filed for this visit.  Visit Diagnosis:  Bilateral low back pain without sciatica  Pain, hip, right  Stiffness due to immobility  Impaired functional mobility and endurance      Subjective Assessment - 01/13/15 0851    Subjective Nathan Sampson reports that MD told him that he may have a labral tear in Rt hip and may need a lumbar injection. He will continue with PT and return to MD in 1 month. He continues to have pain in the Rt hip and LB. Continues to have pain in the LB and Rt hip. Upon discussion patient remembers that he moved his 800 lb motorcycle in the garage  yesterday and feels this may be the cause of increased pain last night.    Currently in Pain? Yes   Pain Score 3    Pain Location Back   Pain Orientation Left;Right  mid 0/10; rt 2/10   Pain Descriptors / Indicators Throbbing   Pain Type Acute pain   Pain Onset More than a month ago   Pain Frequency Intermittent   Multiple Pain Sites Yes   Pain Score 3  throbbing pain in the hip last night 8/10   Pain Location Hip   Pain Orientation Right   Pain Descriptors / Indicators Aching   Pain Type Acute pain   Pain Onset More than a month ago   Pain Frequency Intermittent                         [facility] Adult PT Treatment/Exercise - 01/13/15 0001    Self-Care   Self-Care --  myofacial release with ball ant hip/piriformis/hip abd   Lumbar Exercises: Stretches   Passive Hamstring Stretch 3 reps;30 seconds   Single Knee to Chest Stretch 5 reps   Single Knee to Chest Stretch Limitations each LE 5 sec hold   Press Ups --  10 reps 2-3 sec hold   Lumbar Exercises:  Standing   Wall Slides 5 seconds   Wall Slides Limitations with 3 part core   Lumbar Exercises: Supine   Ab Set 10 reps  10 sec hold   Bridge 5 seconds   Other Supine Lumbar Exercises supine leg press 10 sec hold 10 reps Rt   Cryotherapy   Number Minutes Cryotherapy 15 Minutes   Cryotherapy Location Lumbar Spine  Rt hip   Type of Cryotherapy Ice pack   Electrical Stimulation   Electrical Stimulation Location Lt LB/Rt piriformis   Electrical Stimulation Action IFC   Electrical Stimulation Parameters to tolerance   Electrical Stimulation Goals Pain   Manual Therapy   Joint Mobilization PA glides gradeII L5/L4/L3 and Rt SI joint    Soft tissue mobilization Lt QL/LB; Rt piriformis/hip abductors   Manual Traction through Rt LE - traction through foot/leg extended 30 sec hold 3-4 reps                 PT Education - 01/13/15 0932    Education provided Yes   Education Details Avoiding activities that  irritate symptoms! Working on posture and alignment.. Continuing with ball release work and Production manager) Educated Patient   Methods Explanation;Demonstration;Tactile cues;Verbal cues;Handout   Comprehension Verbalized understanding;Returned demonstration;Verbal cues required;Tactile cues required          PT Short Term Goals - 12/31/14 1301    PT SHORT TERM GOAL #1   Title Patient I in initial HEP 01/26/15   Time 4   Period Weeks   Status On-going   PT SHORT TERM GOAL #2   Title Improve HS flexibility to 75 degrees Rt and 80 degrees Lt with supine testing 01/26/15   Time 4   Period Weeks   Status On-going   PT SHORT TERM GOAL #3   Title Increase trunk extension to 75- 85% prone    Time 4   Period Weeks   Status On-going   PT SHORT TERM GOAL #4   Title Decrease pain to 2-3/10 50-75% of day   Time 4   Period Weeks   Status On-going           PT Long Term Goals - 12/31/14 1301    PT LONG TERM GOAL #1   Title Patient I with HEP for discharge 02/16/15   Time 8   Period Weeks   Status On-going   PT LONG TERM GOAL #2   Title Improve sitting time to 1 hour with min to no pain 02/16/15   Time 8   Period Weeks   Status On-going   PT LONG TERM GOAL #3   Title Improve standing time to 30-45 min with min to no pain 02/16/15   Time 8   Period Weeks   Status On-going   PT LONG TERM GOAL #4   Title Increase activity level allowing patient to return to wood turning without pain 02/16/15   Time 8   Period Weeks   Status On-going               Plan - 01/13/15 0939    Clinical Impression Statement Persistent pain - Rt hip possible labral tear; lumbar radiculopathy. Working to [employer] lumbar spine and decrease muscular tightness anterior/posterior Rt hip.    Pt will benefit from skilled therapeutic intervention in order to improve on the following deficits Pain;Decreased range of motion;Improper body mechanics;Postural dysfunction;Abnormal gait;Decreased activity  tolerance   Rehab Potential Good   PT Frequency 2x / week  PT Treatment/Interventions Patient/family education;ADLs/Self Care Home Management;Therapeutic exercise;Therapeutic activities;Neuromuscular re-education;Cryotherapy;Electrical Stimulation;Iontophoresis 4mg /ml Dexamethasone;Moist Heat;Traction;Ultrasound;Manual techniques   PT Next Visit Plan Review HEP; progress with piriformis stretch; continue with stabilization/exercise as indicated; manual work   PT Home Exercise Plan HEP, body mechanics; purchase TENS unit for apin management   Consulted and Agree with Plan of Care Patient        Problem List Patient Active Problem List   Diagnosis Date Noted  . HLD (hyperlipidemia) 09/02/2014  . ADD (attention deficit disorder) 09/02/2014  . Dyspnea 09/02/2014  . Right lumbar radiculopathy 05/12/2014  . Travel advice encounter 05/12/2014  . Blurry vision, right eye 02/19/2014  . Male hypogonadism 02/05/2014  . Insomnia 02/03/2014  . Annual physical exam 02/03/2014  . Allergic rhinitis 02/03/2014  . Retrocalcaneal bursitis 01/20/2014  . Left ankle pain 01/20/2014  . Osteoarthritis of right hip 01/20/2014  . Coronary artery disease   . Dyslipidemia   . Hypertension     Janie Strothman Nilda Simmer, PT, MPH 01/13/2015, 9:41 AM  [facility] [place], [place], [address] Phone: [phone]   Fax:  [phone]

## 2015-01-13 NOTE — Patient Instructions (Signed)
Strengthening: Wall Slide   Leaning on wall, slowly lower buttocks until thighs are parallel to floor. Hold __5-10__ seconds.  Repeat _10___ times per set. Do __1-2__ sets per session. Do __1-2__ sessions per day.   Single Leg Press   Straighten right leg down to floor. Bring toes AND forefeet toward knees, extend heels. Press leg down. DO NOT BEND KNEES. Hold _5-10__ seconds. .  Repeat _10__ times.  Bridging   Slowly raise buttocks from floor, keeping stomach tight. Repeat __10__ times per set. Hold 10 sec Do _1-3___ sets per session. Do _1-2__ sessions per day.   Ball release work standing with ball in front of the right hip  Standing with ball in buttocks area

## 2015-01-13 NOTE — Telephone Encounter (Signed)
rx in box 

## 2015-01-13 NOTE — Telephone Encounter (Signed)
Patient advised.

## 2015-01-18 ENCOUNTER — Encounter: Payer: Self-pay | Admitting: Rehabilitative and Restorative Service Providers"

## 2015-01-18 ENCOUNTER — Ambulatory Visit (INDEPENDENT_AMBULATORY_CARE_PROVIDER_SITE_OTHER): Payer: BLUE CROSS/BLUE SHIELD | Admitting: Rehabilitative and Restorative Service Providers"

## 2015-01-18 DIAGNOSIS — M25551 Pain in right hip: Secondary | ICD-10-CM

## 2015-01-18 DIAGNOSIS — M545 Low back pain, unspecified: Secondary | ICD-10-CM

## 2015-01-18 DIAGNOSIS — M2560 Stiffness of unspecified joint, not elsewhere classified: Secondary | ICD-10-CM

## 2015-01-18 DIAGNOSIS — M623 Immobility syndrome (paraplegic): Secondary | ICD-10-CM

## 2015-01-18 DIAGNOSIS — Z7409 Other reduced mobility: Secondary | ICD-10-CM | POA: Diagnosis not present

## 2015-01-18 NOTE — Therapy (Signed)
[place], [place], [address] Phone: [phone]   Fax:  [phone]  Physical Therapy Treatment  Patient Details  Name: Nathan Sampson MRN: [id number] Date of Birth: 1955-10-22 Referring Provider:  Silverio Decamp,*  Encounter Date: 01/18/2015      PT End of Session - 01/18/15 0928    Visit Number 5   Number of Visits 16   Date for PT Re-Evaluation 02/16/15   PT Start Time 9449   PT Stop Time 0940   PT Time Calculation (min) 53 min   Activity Tolerance Patient tolerated treatment well;No increased pain      Past Medical History  Diagnosis Date  . Coronary artery disease     Status post stenting of the mid LAD and distal right coronary with drug-eluting stents in July 2009.  He is Asymptomatic. He had a strss echo in September 2009 which was normal.  . Dyslipidemia   . Hypertension     Poorly Controlled. Exacerbated by sedentary lifestyle, diet and weight gain, and stress.  . OCD (obsessive compulsive disorder)     Past Surgical History  Procedure Laterality Date  . Arthroscopic repair acl    . Cardiovascular stress test  11-29-2009    EF 57%  . Hernia repair      AGE 59 - HERNIA REPAIR  . Surgery for sleep apnea      ABOUT 10 YRS AGO-? PALATOPLASTY AND T/A-SURGERY WAS DONE IN [place]    There were no vitals filed for this visit.  Visit Diagnosis:  Bilateral low back pain without sciatica  Pain, hip, right  Stiffness due to immobility  Impaired functional mobility and endurance      Subjective Assessment - 01/18/15 0850    Subjective Amon reports that his back is some better but his hip continues to be the problem. He has pain in the hip especially at night.    Currently in Pain? Yes   Pain Score 2    Pain Location Hip   Pain Orientation Right   Pain Descriptors / Indicators Sharp   Pain Type Acute pain   Pain Onset More than a month ago   Pain Frequency  Intermittent                         [facility] Adult PT Treatment/Exercise - 01/18/15 0001    Self-Care   Self-Care --  education re position for wood working   Lumbar Exercises: Hydrologist 3 reps;30 seconds   Single Knee to Chest Stretch 5 reps   Single Knee to Chest Stretch Limitations each LE 5 sec hold   Press Ups --  10 reps 2-3 sec hold   ITB Stretch 3 reps;30 seconds   ITB Stretch Limitations supine with strap   Lumbar Exercises: Standing   Wall Slides 5 seconds   Wall Slides Limitations with 3 part core   Lumbar Exercises: Supine   Ab Set 10 reps  10 sec hold   Bridge 5 seconds   Other Supine Lumbar Exercises supine leg press 10 sec hold 10 reps Rt   Lumbar Exercises: Quadruped   [place] 10 reps   Cryotherapy   Number Minutes Cryotherapy 15 Minutes   Cryotherapy Location Lumbar Spine   Type of Cryotherapy Ice pack   Electrical Stimulation   Electrical Stimulation Location Lt LB/Rt piriformis   Electrical Stimulation Action IFC  Electrical Stimulation Parameters to tolerance   Electrical Stimulation Goals Pain   Manual Therapy   Joint Mobilization PA glides grade II L5/L4/L3; Rt SI joint grade II; Rt hip PA glide pt in prone   Soft tissue mobilization Lt QL/LB; Rt piriformis/hip abductors   Manual Traction through Rt LE - traction through foot/leg extended 30 sec hold 3-4 reps                 PT Education - 01/18/15 0856    Education provided Yes   Education Details Education re-pivoting feet instead of turning body when he is doing his wood working. Encouraged continued back care and HEP. Added hip extension and IT band stretch   Person(s) Educated Patient   Methods Explanation;Demonstration;Tactile cues;Verbal cues;Handout   Comprehension Verbalized understanding;Returned demonstration;Verbal cues required;Tactile cues required          PT Short Term Goals - 12/31/14 1301    PT SHORT TERM GOAL #1    Title Patient I in initial HEP 01/26/15   Time 4   Period Weeks   Status On-going   PT SHORT TERM GOAL #2   Title Improve HS flexibility to 75 degrees Rt and 80 degrees Lt with supine testing 01/26/15   Time 4   Period Weeks   Status On-going   PT SHORT TERM GOAL #3   Title Increase trunk extension to 75- 85% prone    Time 4   Period Weeks   Status On-going   PT SHORT TERM GOAL #4   Title Decrease pain to 2-3/10 50-75% of day   Time 4   Period Weeks   Status On-going           PT Long Term Goals - 12/31/14 1301    PT LONG TERM GOAL #1   Title Patient I with HEP for discharge 02/16/15   Time 8   Period Weeks   Status On-going   PT LONG TERM GOAL #2   Title Improve sitting time to 1 hour with min to no pain 02/16/15   Time 8   Period Weeks   Status On-going   PT LONG TERM GOAL #3   Title Improve standing time to 30-45 min with min to no pain 02/16/15   Time 8   Period Weeks   Status On-going   PT LONG TERM GOAL #4   Title Increase activity level allowing patient to return to wood turning without pain 02/16/15   Time 8   Period Weeks   Status On-going               Plan - 01/18/15 5916    Clinical Impression Statement Note increased flexibility with hamstring stretch; decreased muscular tightness to palpation through lumbar and Rt hip musculature; increased mobility with joint mobilization.  Multiple problems including lumbar dsfunction with lumbar radiciulopathy; Rt hip muscular tightness through piriformis and hip abductors; Rt hip pain and possible labral tear.   Pt will benefit from skilled therapeutic intervention in order to improve on the following deficits Pain;Decreased range of motion;Improper body mechanics;Postural dysfunction;Abnormal gait;Decreased activity tolerance   Rehab Potential Good   PT Frequency 2x / week   PT Duration 8 weeks   PT Treatment/Interventions Patient/family education;ADLs/Self Care Home Management;Therapeutic  exercise;Therapeutic activities;Neuromuscular re-education;Cryotherapy;Electrical Stimulation;Iontophoresis 4mg /ml Dexamethasone;Moist Heat;Traction;Ultrasound;Manual techniques   PT Next Visit Plan Review HEP; progress with piriformis stretch; continue with stabilization/exercise as indicated; manual work   PT Home Exercise Plan HEP, body mechanics; recommended purchase TENS unit  for pain management   Consulted and Agree with Plan of Care Patient        Problem List Patient Active Problem List   Diagnosis Date Noted  . HLD (hyperlipidemia) 09/02/2014  . ADD (attention deficit disorder) 09/02/2014  . Dyspnea 09/02/2014  . Right lumbar radiculopathy 05/12/2014  . Travel advice encounter 05/12/2014  . Blurry vision, right eye 02/19/2014  . Male hypogonadism 02/05/2014  . Insomnia 02/03/2014  . Annual physical exam 02/03/2014  . Allergic rhinitis 02/03/2014  . Retrocalcaneal bursitis 01/20/2014  . Left ankle pain 01/20/2014  . Osteoarthritis of right hip 01/20/2014  . Coronary artery disease   . Dyslipidemia   . Hypertension     Annalisse Minkoff Nilda Simmer, PT, MPH 01/18/2015, 9:32 AM  [facility] [place], [place], [address] Phone: [phone]   Fax:  [phone]

## 2015-01-18 NOTE — Patient Instructions (Addendum)
No pain!!  Strengthening: Hip Extension (Prone)   Tighten muscles on front of left thigh, then lift leg _4-5 ___ inches from surface, keeping knee locked. Repeat _10___ times per set. Do __1-2__ sets per session. Do _1-2___ sessions per day.    Hamstring Step 1   Straighten knee.Pull leg up with knee straight. Pull leg across body. Hold _30__ seconds. Relax knee by returning foot to start. Repeat _3__ times.

## 2015-01-20 ENCOUNTER — Encounter: Payer: Self-pay | Admitting: Rehabilitative and Restorative Service Providers"

## 2015-01-20 ENCOUNTER — Ambulatory Visit (INDEPENDENT_AMBULATORY_CARE_PROVIDER_SITE_OTHER): Payer: BLUE CROSS/BLUE SHIELD | Admitting: Rehabilitative and Restorative Service Providers"

## 2015-01-20 DIAGNOSIS — M623 Immobility syndrome (paraplegic): Secondary | ICD-10-CM | POA: Diagnosis not present

## 2015-01-20 DIAGNOSIS — M2560 Stiffness of unspecified joint, not elsewhere classified: Secondary | ICD-10-CM

## 2015-01-20 DIAGNOSIS — Z7409 Other reduced mobility: Secondary | ICD-10-CM

## 2015-01-20 DIAGNOSIS — M25551 Pain in right hip: Secondary | ICD-10-CM | POA: Diagnosis not present

## 2015-01-20 DIAGNOSIS — M545 Low back pain, unspecified: Secondary | ICD-10-CM

## 2015-01-20 NOTE — Patient Instructions (Signed)
Combination (Hook-Lying)   Tighten stomach and slowly raise left leg and lower opposite arm over head. Keep trunk rigid. Repeat _10-20___ times per set. Do __1-2__ sets per session. Do __2__ sessions per day.   Piriformis Stretch, Supine   Lie supine, one ankle crossed onto opposite knee. Holding bottom leg behind knee, gently pull legs toward chest until stretch is felt in buttock of top leg. Hold _20__ seconds.   Repeat _3__ times per session. Do _2__ sessions per day.

## 2015-01-20 NOTE — Therapy (Signed)
[place], [place], [address] Phone: [phone]   Fax:  [phone]  Physical Therapy Treatment  Patient Details  Name: Nathan Sampson MRN: [id number] Date of Birth: 10-Aug-1955 Referring Provider:  Silverio Decamp,*  Encounter Date: 01/20/2015      PT End of Session - 01/20/15 1249    Visit Number 6   Number of Visits 16   Date for PT Re-Evaluation 02/16/15   PT Start Time 0850   PT Stop Time 0942   PT Time Calculation (min) 52 min   Activity Tolerance Patient tolerated treatment well;No increased pain      Past Medical History  Diagnosis Date  . Coronary artery disease     Status post stenting of the mid LAD and distal right coronary with drug-eluting stents in July 2009.  He is Asymptomatic. He had a strss echo in September 2009 which was normal.  . Dyslipidemia   . Hypertension     Poorly Controlled. Exacerbated by sedentary lifestyle, diet and weight gain, and stress.  . OCD (obsessive compulsive disorder)     Past Surgical History  Procedure Laterality Date  . Arthroscopic repair acl    . Cardiovascular stress test  11-29-2009    EF 57%  . Hernia repair      AGE 4 - HERNIA REPAIR  . Surgery for sleep apnea      ABOUT 10 YRS AGO-? PALATOPLASTY AND T/A-SURGERY WAS DONE IN [place]    There were no vitals filed for this visit.  Visit Diagnosis:  Bilateral low back pain without sciatica  Pain, hip, right  Stiffness due to immobility  Impaired functional mobility and endurance      Subjective Assessment - 01/20/15 1235    Subjective Nathan Sampson reports that he had decreased symptoms for ~ 1 to 1 1/2 days following treatment. He responds well to mobs and manual work through lumbar spine and Rt hip. He continues to have the greatest problem with REt hip. He does OK during the day byt has significant increase in pain when he lies down to sleep at night. Will be traveling some  next week which is of concern.    Currently in Pain? Yes   Pain Score 2    Pain Location Hip   Pain Orientation Right   Pain Descriptors / Indicators Aching;Throbbing   Pain Type Acute pain   Pain Onset More than a month ago   Pain Frequency Intermittent   Aggravating Factors  lying down to sleep - unable to sleep on side or back well   Pain Relieving Factors meds; changing positions                         [facility] Adult PT Treatment/Exercise - 01/20/15 0001    Lumbar Exercises: Stretches   Passive Hamstring Stretch 3 reps;30 seconds   Single Knee to Chest Stretch 5 reps   Single Knee to Chest Stretch Limitations each LE 5 sec hold   ITB Stretch 3 reps;30 seconds   ITB Stretch Limitations supine with strap   Lumbar Exercises: Supine   Ab Set 10 reps  10 sec hold   AB Set Limitations working to incoporate 3 part core into all exercises   Bridge 5 seconds   Bridge Limitations added ball squeeze with bridging for 10 reps 5 sec hold   Other Supine Lumbar Exercises alt arm and leg in supine 10 reps  2 sec pause/required verbal cues patient had difficulty with coordinating arm/leg movement  trial of leg extension with bridging incresed Rt hip pain   Cryotherapy   Number Minutes Cryotherapy 15 Minutes   Cryotherapy Location Lumbar Spine;Hip   Type of Cryotherapy Ice pack   Electrical Stimulation   Electrical Stimulation Location Lt LB/Rt piriformis   Electrical Stimulation Action IFC   Electrical Stimulation Parameters to tolerance   Electrical Stimulation Goals Pain   Manual Therapy   Joint Mobilization PA glides grade II L5/L4/L3; Rt SI joint grade II; Rt hip PA glide pt in prone   Soft tissue mobilization Lt QL/LB; Rt piriformis/hip abductors                PT Education - 01/20/15 1249    Education provided Yes   Education Details Encouraged consistent HEP working on stretching and sabilization exercises. Added exercises for HEP   Person(s) Educated  Patient   Methods Explanation;Demonstration;Tactile cues;Verbal cues;Handout   Comprehension Verbalized understanding;Returned demonstration;Verbal cues required;Tactile cues required          PT Short Term Goals - 12/31/14 1301    PT SHORT TERM GOAL #1   Title Patient I in initial HEP 01/26/15   Time 4   Period Weeks   Status On-going   PT SHORT TERM GOAL #2   Title Improve HS flexibility to 75 degrees Rt and 80 degrees Lt with supine testing 01/26/15   Time 4   Period Weeks   Status On-going   PT SHORT TERM GOAL #3   Title Increase trunk extension to 75- 85% prone    Time 4   Period Weeks   Status On-going   PT SHORT TERM GOAL #4   Title Decrease pain to 2-3/10 50-75% of day   Time 4   Period Weeks   Status On-going           PT Long Term Goals - 12/31/14 1301    PT LONG TERM GOAL #1   Title Patient I with HEP for discharge 02/16/15   Time 8   Period Weeks   Status On-going   PT LONG TERM GOAL #2   Title Improve sitting time to 1 hour with min to no pain 02/16/15   Time 8   Period Weeks   Status On-going   PT LONG TERM GOAL #3   Title Improve standing time to 30-45 min with min to no pain 02/16/15   Time 8   Period Weeks   Status On-going   PT LONG TERM GOAL #4   Title Increase activity level allowing patient to return to wood turning without pain 02/16/15   Time 8   Period Weeks   Status On-going               Plan - 01/20/15 1251    Clinical Impression Statement Nathan Sampson continues to exhibit signs and symptoms consistent with lumbar radiculopathy as well as Rt hip dysfunction which is possible strain vs labral tear. He is likely inconsistent with HEP but does domonstrate incresed tolerance for exercise in the clinic; some improvement in mobility and decrease in tissue tightness through lumbar and Rt hips region.    Pt will benefit from skilled therapeutic intervention in order to improve on the following deficits Pain;Decreased range of  motion;Improper body mechanics;Postural dysfunction;Abnormal gait;Decreased activity tolerance   Rehab Potential Good   PT Frequency 2x / week   PT Duration 8 weeks   PT Treatment/Interventions Patient/family education;ADLs/Self  Care Home Management;Therapeutic exercise;Therapeutic activities;Neuromuscular re-education;Cryotherapy;Electrical Stimulation;Iontophoresis 38m/ml Dexamethasone;Moist Heat;Traction;Ultrasound;Manual techniques   PT Next Visit Plan Review HEP; continue with stabilization/exercise as indicated; manual work   PT Home Exercise Plan HEP, body mechanics; recommended purchase TENS unit for pain management   Consulted and Agree with Plan of Care Patient        Problem List Patient Active Problem List   Diagnosis Date Noted  . HLD (hyperlipidemia) 09/02/2014  . ADD (attention deficit disorder) 09/02/2014  . Dyspnea 09/02/2014  . Right lumbar radiculopathy 05/12/2014  . Travel advice encounter 05/12/2014  . Blurry vision, right eye 02/19/2014  . Male hypogonadism 02/05/2014  . Insomnia 02/03/2014  . Annual physical exam 02/03/2014  . Allergic rhinitis 02/03/2014  . Retrocalcaneal bursitis 01/20/2014  . Left ankle pain 01/20/2014  . Osteoarthritis of right hip 01/20/2014  . Coronary artery disease   . Dyslipidemia   . Hypertension     Danity Schmelzer PNilda Simmer PT, MPH 01/20/2015, 12:55 PM  [facility] [address]Phone: [phone]  Fax:  [phone]

## 2015-01-22 ENCOUNTER — Other Ambulatory Visit: Payer: Self-pay | Admitting: Sports Medicine

## 2015-01-25 ENCOUNTER — Ambulatory Visit (INDEPENDENT_AMBULATORY_CARE_PROVIDER_SITE_OTHER): Payer: BLUE CROSS/BLUE SHIELD | Admitting: Rehabilitative and Restorative Service Providers"

## 2015-01-25 ENCOUNTER — Other Ambulatory Visit: Payer: Self-pay | Admitting: Sports Medicine

## 2015-01-25 ENCOUNTER — Encounter: Payer: Self-pay | Admitting: Rehabilitative and Restorative Service Providers"

## 2015-01-25 DIAGNOSIS — M25551 Pain in right hip: Secondary | ICD-10-CM | POA: Diagnosis not present

## 2015-01-25 DIAGNOSIS — M545 Low back pain, unspecified: Secondary | ICD-10-CM

## 2015-01-25 DIAGNOSIS — Z7409 Other reduced mobility: Secondary | ICD-10-CM

## 2015-01-25 DIAGNOSIS — M2560 Stiffness of unspecified joint, not elsewhere classified: Secondary | ICD-10-CM

## 2015-01-25 DIAGNOSIS — M623 Immobility syndrome (paraplegic): Secondary | ICD-10-CM | POA: Diagnosis not present

## 2015-01-25 NOTE — Therapy (Signed)
[place], [place], [address] Phone: [phone]   Fax:  [phone]  Physical Therapy Treatment  Patient Details  Name: Nathan Sampson MRN: [id number] Date of Birth: 1956-05-04 Referring Provider:  Silverio Decamp,*  Encounter Date: 01/25/2015      PT End of Session - 01/25/15 1230    Visit Number 7   Number of Visits 16   Date for PT Re-Evaluation 02/16/15   PT Start Time 0856   PT Stop Time 1008   PT Time Calculation (min) 72 min   Activity Tolerance Patient tolerated treatment well;No increased pain      Past Medical History  Diagnosis Date  . Coronary artery disease     Status post stenting of the mid LAD and distal right coronary with drug-eluting stents in July 2009.  He is Asymptomatic. He had a strss echo in September 2009 which was normal.  . Dyslipidemia   . Hypertension     Poorly Controlled. Exacerbated by sedentary lifestyle, diet and weight gain, and stress.  . OCD (obsessive compulsive disorder)     Past Surgical History  Procedure Laterality Date  . Arthroscopic repair acl    . Cardiovascular stress test  11-29-2009    EF 57%  . Hernia repair      AGE 59 - HERNIA REPAIR  . Surgery for sleep apnea      ABOUT 10 YRS AGO-? PALATOPLASTY AND T/A-SURGERY WAS DONE IN [place]    There were no vitals filed for this visit.  Visit Diagnosis:  Bilateral low back pain without sciatica  Pain, hip, right  Stiffness due to immobility  Impaired functional mobility and endurance          [facility] PT Assessment - 01/25/15 0001    AROM   Overall AROM  --  limited bilat IR to neutral tested in prone   Palpation   SI assessment  Painful spring testing Rt SI   Palpation comment tight Lt QL; Rt piriformis   Special Tests   Leg length test  --  Lt leg longer supine at malleolus                      [facility] Adult PT Treatment/Exercise - 01/25/15 0001    Therapeutic Activites    Therapeutic Activities --  to avoid twisting trunk    Lumbar Exercises: Stretches   Passive Hamstring Stretch 3 reps;30 seconds   ITB Stretch 3 reps;30 seconds   ITB Stretch Limitations supine with strap   Lumbar Exercises: Supine   Ab Set 10 reps  10 sec hold   AB Set Limitations working to incoporate 3 part core into all exercises   Other Supine Lumbar Exercises alternate knee drop/windshield wiper supine   Cryotherapy   Number Minutes Cryotherapy 15 Minutes   Cryotherapy Location Lumbar Spine   Type of Cryotherapy Ice pack   Electrical Stimulation   Electrical Stimulation Location Lt LB/Rt piriformis   Electrical Stimulation Action HV   Electrical Stimulation Parameters to tolerance   Electrical Stimulation Goals Pain;Tone   Ultrasound   Ultrasound Location Lt QL   Ultrasound Parameters Combo [place] 1 mHz; 100%, 1.5 w/cm2   Ultrasound Goals Pain   Manual Therapy   Joint Mobilization Lateral glides L5 area Grade 11 and 111 improved mobility noted with mobs    Soft tissue mobilization Lt QL deep tissue release work    Manual Traction manual resistance  to Rt hip flexion with improved leg length difference noted post treatment                PT Education - 01/25/15 1229    Education provided Yes   Education Details Avoid twisting with trunk/LE's; continue with lumbar core stabilization; hold bridging; use TENS unit as available(pt has ordered unit for home)   Person(s) Educated Patient   Methods Explanation   Comprehension Verbalized understanding          PT Short Term Goals - 12/31/14 1301    PT SHORT TERM GOAL #1   Title Patient I in initial HEP 01/26/15   Time 4   Period Weeks   Status On-going   PT SHORT TERM GOAL #2   Title Improve HS flexibility to 75 degrees Rt and 80 degrees Lt with supine testing 01/26/15   Time 4   Period Weeks   Status On-going   PT SHORT TERM GOAL #3   Title Increase trunk extension to 75- 85% prone     Time 4   Period Weeks   Status On-going   PT SHORT TERM GOAL #4   Title Decrease pain to 2-3/10 50-75% of day   Time 4   Period Weeks   Status On-going           PT Long Term Goals - 12/31/14 1301    PT LONG TERM GOAL #1   Title Patient I with HEP for discharge 02/16/15   Time 8   Period Weeks   Status On-going   PT LONG TERM GOAL #2   Title Improve sitting time to 1 hour with min to no pain 02/16/15   Time 8   Period Weeks   Status On-going   PT LONG TERM GOAL #3   Title Improve standing time to 30-45 min with min to no pain 02/16/15   Time 8   Period Weeks   Status On-going   PT LONG TERM GOAL #4   Title Increase activity level allowing patient to return to wood turning without pain 02/16/15   Time 8   Period Weeks   Status On-going               Plan - 01/25/15 1237    Clinical Impression Statement Con presents with slight leg length difference Lt longer than Rt. Responded well to resisted Rt hip flexion, mobs to L%; work through [employer]. Pt reported less popping in Rt hip post treatment. Dietrich will hold bridging and add IR/windshield wiper  supine with feet apart.    Pt will benefit from skilled therapeutic intervention in order to improve on the following deficits Pain;Decreased range of motion;Improper body mechanics;Postural dysfunction;Abnormal gait;Decreased activity tolerance   Rehab Potential Good   PT Frequency 2x / week   PT Duration 8 weeks   PT Treatment/Interventions Patient/family education;ADLs/Self Care Home Management;Therapeutic exercise;Therapeutic activities;Neuromuscular re-education;Cryotherapy;Electrical Stimulation;Iontophoresis 4mg /ml Dexamethasone;Moist Heat;Traction;Ultrasound;Manual techniques   PT Home Exercise Plan HEP, body mechanics; recommended purchase TENS unit for pain management   Consulted and Agree with Plan of Care Patient        Problem List Patient Active Problem List   Diagnosis Date Noted  . HLD  (hyperlipidemia) 09/02/2014  . ADD (attention deficit disorder) 09/02/2014  . Dyspnea 09/02/2014  . Right lumbar radiculopathy 05/12/2014  . Travel advice encounter 05/12/2014  . Blurry vision, right eye 02/19/2014  . Male hypogonadism 02/05/2014  . Insomnia 02/03/2014  . Annual physical exam 02/03/2014  .  Allergic rhinitis 02/03/2014  . Retrocalcaneal bursitis 01/20/2014  . Left ankle pain 01/20/2014  . Osteoarthritis of right hip 01/20/2014  . Coronary artery disease   . Dyslipidemia   . Hypertension     Donalee Gaumond Nilda Simmer, PT, MPH 01/25/2015, 12:49 PM  [facility] [place], [place], [address] Phone: [phone]   Fax:  [phone]

## 2015-02-01 ENCOUNTER — Ambulatory Visit (INDEPENDENT_AMBULATORY_CARE_PROVIDER_SITE_OTHER): Payer: BLUE CROSS/BLUE SHIELD | Admitting: Rehabilitative and Restorative Service Providers"

## 2015-02-01 ENCOUNTER — Encounter: Payer: Self-pay | Admitting: Rehabilitative and Restorative Service Providers"

## 2015-02-01 DIAGNOSIS — M25551 Pain in right hip: Secondary | ICD-10-CM | POA: Diagnosis not present

## 2015-02-01 DIAGNOSIS — M2560 Stiffness of unspecified joint, not elsewhere classified: Secondary | ICD-10-CM

## 2015-02-01 DIAGNOSIS — M623 Immobility syndrome (paraplegic): Secondary | ICD-10-CM | POA: Diagnosis not present

## 2015-02-01 DIAGNOSIS — Z7409 Other reduced mobility: Secondary | ICD-10-CM | POA: Diagnosis not present

## 2015-02-01 DIAGNOSIS — M545 Low back pain, unspecified: Secondary | ICD-10-CM

## 2015-02-01 NOTE — Therapy (Signed)
[place], [place], [address] Phone: [phone]   Fax:  [phone]  Physical Therapy Treatment  Patient Details  Name: Nathan Sampson MRN: [id number] Date of Birth: 1956-04-05 Referring Provider:  Silverio Decamp,*  Encounter Date: 02/01/2015      PT End of Session - 02/01/15 1258    Visit Number 8   Number of Visits 16   Date for PT Re-Evaluation 02/16/15   PT Start Time 0350   PT Stop Time 0952   PT Time Calculation (min) 57 min   Activity Tolerance Patient tolerated treatment well;No increased pain      Past Medical History  Diagnosis Date  . Coronary artery disease     Status post stenting of the mid LAD and distal right coronary with drug-eluting stents in July 2009.  He is Asymptomatic. He had a strss echo in September 2009 which was normal.  . Dyslipidemia   . Hypertension     Poorly Controlled. Exacerbated by sedentary lifestyle, diet and weight gain, and stress.  . OCD (obsessive compulsive disorder)     Past Surgical History  Procedure Laterality Date  . Arthroscopic repair acl    . Cardiovascular stress test  11-29-2009    EF 57%  . Hernia repair      AGE 59 - HERNIA REPAIR  . Surgery for sleep apnea      ABOUT 60 YRS AGO-? PALATOPLASTY AND T/A-SURGERY WAS DONE IN [place]    There were no vitals filed for this visit.  Visit Diagnosis:  Bilateral low back pain without sciatica  Pain, hip, right  Stiffness due to immobility  Impaired functional mobility and endurance      Subjective Assessment - 02/01/15 0854    Subjective Pt reports good relief of popping in Rt hip with techniques from last visit lasting 1 1/2 to 2 days. Still has pain in the right hip especially at night. Pain varies form 0/10 to 5 or 6/10.but does noitice that the frequency has decreased; intensity is about the same; toe numbness remains unchanged; duration is about the same. Driving  increases pain - holding foot on the gas increases pain. Pt was driving a lot more traveling with work over the past week.    Currently in Pain? Yes   Pain Score 3    Pain Location Hip   Pain Orientation Right   Pain Descriptors / Indicators Aching;Throbbing   Pain Type Acute pain            [facility] PT Assessment - 02/01/15 0001    AROM   Lumbar Flexion 85%   Lumbar Extension 60%   Lumbar - Right Side Bend 85%   Lumbar - Left Side Bend 80%   Palpation   SI assessment  Tight Lt > Rt   Palpation comment tight Lt QL; Rt piriformis                     [facility] Adult PT Treatment/Exercise - 02/01/15 0001    Self-Care   Self-Care --  myofacial ball release work   Lumbar Exercises: Stretches   Passive Hamstring Stretch 3 reps;30 seconds   ITB Stretch 3 reps;30 seconds   ITB Stretch Limitations supine with strap   Lumbar Exercises: Supine   Ab Set 10 reps  10 sec hold   AB Set Limitations working to incoporate 3 part core into all exercises   Other Supine Lumbar Exercises resisted  hio flexion in ~90 deg hip flexioin in supine - 5 reps 2 sets with patient providing resistance himself on second set   Other Supine Lumbar Exercises alternate knee drop/windshield wiper supine   Cryotherapy   Number Minutes Cryotherapy 15 Minutes   Cryotherapy Location Lumbar Spine   Type of Cryotherapy Ice pack   Ultrasound   Ultrasound Location Lt QL   Ultrasound Parameters Combo !S 1 mHz; 100%, 1.4 w/cm2   Ultrasound Goals Pain  muscle tightness    Manual Therapy   Joint Mobilization Lateral glides L5 area Grade 11 and 111 improved mobility noted with mobs    Soft tissue mobilization Lt QL deep tissue release work    Manual Traction manual resistance to Rt hip flexion with improved leg length difference noted post treatment                PT Education - 02/01/15 1257    Education provided Yes   Education Details resisted hip flexion Rt in supine pushing into his hand for 5  sec 5 reps 2 sets - good relief of symptoms reported with this technique   Person(s) Educated Patient   Methods Explanation;Demonstration;Tactile cues;Verbal cues   Comprehension Verbalized understanding;Returned demonstration;Verbal cues required;Tactile cues required          PT Short Term Goals - 12/31/14 1301    PT SHORT TERM GOAL #1   Title Patient I in initial HEP 01/26/15   Time 4   Period Weeks   Status On-going   PT SHORT TERM GOAL #2   Title Improve HS flexibility to 75 degrees Rt and 80 degrees Lt with supine testing 01/26/15   Time 4   Period Weeks   Status On-going   PT SHORT TERM GOAL #3   Title Increase trunk extension to 75- 85% prone    Time 4   Period Weeks   Status On-going   PT SHORT TERM GOAL #4   Title Decrease pain to 2-3/10 50-75% of day   Time 4   Period Weeks   Status On-going           PT Long Term Goals - 12/31/14 1301    PT LONG TERM GOAL #1   Title Patient I with HEP for discharge 02/16/15   Time 8   Period Weeks   Status On-going   PT LONG TERM GOAL #2   Title Improve sitting time to 1 hour with min to no pain 02/16/15   Time 8   Period Weeks   Status On-going   PT LONG TERM GOAL #3   Title Improve standing time to 30-45 min with min to no pain 02/16/15   Time 8   Period Weeks   Status On-going   PT LONG TERM GOAL #4   Title Increase activity level allowing patient to return to wood turning without pain 02/16/15   Time 8   Period Weeks   Status On-going               Plan - 02/01/15 1259    Clinical Impression Statement Good response to pelvic realignment technique - resisted hip flexion in supine. Patient was instructed in this self mob techniques for home. He was encouraged to continue with spine neurtal positions and activities; working on core stabilization    Pt will benefit from skilled therapeutic intervention in order to improve on the following deficits Pain;Decreased range of motion;Improper body  mechanics;Postural dysfunction;Abnormal gait;Decreased activity tolerance   Rehab Potential  Good   PT Frequency 2x / week   PT Duration 8 weeks   PT Treatment/Interventions Patient/family education;ADLs/Self Care Home Management;Therapeutic exercise;Therapeutic activities;Neuromuscular re-education;Cryotherapy;Electrical Stimulation;Iontophoresis 4mg /ml Dexamethasone;Moist Heat;Traction;Ultrasound;Manual techniques   PT Next Visit Plan Review HEP; continue with stabilization/exercise as indicated; manual work   PT Home Exercise Plan HEP, body mechanics; recommended purchase TENS unit for pain management   Consulted and Agree with Plan of Care Patient        Problem List Patient Active Problem List   Diagnosis Date Noted  . HLD (hyperlipidemia) 09/02/2014  . ADD (attention deficit disorder) 09/02/2014  . Dyspnea 09/02/2014  . Right lumbar radiculopathy 05/12/2014  . Travel advice encounter 05/12/2014  . Blurry vision, right eye 02/19/2014  . Male hypogonadism 02/05/2014  . Insomnia 02/03/2014  . Annual physical exam 02/03/2014  . Allergic rhinitis 02/03/2014  . Retrocalcaneal bursitis 01/20/2014  . Left ankle pain 01/20/2014  . Osteoarthritis of right hip 01/20/2014  . Coronary artery disease   . Dyslipidemia   . Hypertension     Hiroshi Krummel Nilda Simmer PT, MPH 02/01/2015, 1:03 PM  [facility] [place], [place], [address] Phone: [phone]   Fax:  [phone]

## 2015-02-03 ENCOUNTER — Encounter: Payer: Self-pay | Admitting: Physical Therapy

## 2015-02-03 ENCOUNTER — Ambulatory Visit (INDEPENDENT_AMBULATORY_CARE_PROVIDER_SITE_OTHER): Payer: BLUE CROSS/BLUE SHIELD | Admitting: Physical Therapy

## 2015-02-03 DIAGNOSIS — M25551 Pain in right hip: Secondary | ICD-10-CM

## 2015-02-03 DIAGNOSIS — M623 Immobility syndrome (paraplegic): Secondary | ICD-10-CM

## 2015-02-03 DIAGNOSIS — Z7409 Other reduced mobility: Secondary | ICD-10-CM

## 2015-02-03 DIAGNOSIS — M545 Low back pain, unspecified: Secondary | ICD-10-CM

## 2015-02-03 DIAGNOSIS — M2560 Stiffness of unspecified joint, not elsewhere classified: Secondary | ICD-10-CM

## 2015-02-03 NOTE — Therapy (Signed)
[place], [place], [address] Phone: [phone]   Fax:  [phone]  Physical Therapy Treatment  Patient Details  Name: Nathan Sampson MRN: [id number] Date of Birth: [birth year] Referring Provider:  Silverio Decamp,*  Encounter Date: 02/03/2015      PT End of Session - 02/03/15 1418    Visit Number 9   Number of Visits 16   Date for PT Re-Evaluation 02/16/15   PT Start Time 0850   PT Stop Time 0953   PT Time Calculation (min) 63 min   Activity Tolerance Patient tolerated treatment well      Past Medical History  Diagnosis Date  . Coronary artery disease     Status post stenting of the mid LAD and distal right coronary with drug-eluting stents in July 2009.  He is Asymptomatic. He had a strss echo in September 2009 which was normal.  . Dyslipidemia   . Hypertension     Poorly Controlled. Exacerbated by sedentary lifestyle, diet and weight gain, and stress.  . OCD (obsessive compulsive disorder)     Past Surgical History  Procedure Laterality Date  . Arthroscopic repair acl    . Cardiovascular stress test  11-29-2009    EF 57%  . Hernia repair      AGE 16 - HERNIA REPAIR  . Surgery for sleep apnea      ABOUT 10 YRS AGO-? PALATOPLASTY AND T/A-SURGERY WAS DONE IN [place]    There were no vitals filed for this visit.  Visit Diagnosis:  Bilateral low back pain without sciatica  Pain, hip, right  Stiffness due to immobility      Subjective Assessment - 02/03/15 0854    Subjective Pt  reports he felt pretty good after muscle energy technique in the clinic however he states he cant get the same relief when he tries to do it himself.    Currently in Pain? Yes   Pain Score 2    Pain Location Back   Pain Orientation Left;Lower   Pain Descriptors / Indicators Tightness   Pain Type Acute pain   Pain Radiating Towards Rt middle three toes are numb    Pain Onset More than a  month ago   Pain Frequency Intermittent                         [facility] Adult PT Treatment/Exercise - 02/03/15 0001    Cryotherapy   Number Minutes Cryotherapy 15 Minutes   Cryotherapy Location Lumbar Spine  and Rt buttocks   Type of Cryotherapy Ice pack   Electrical Stimulation   Electrical Stimulation Location Lt LB/Rt piriformis   Electrical Stimulation Action IFC   Electrical Stimulation Parameters to tolerance   Electrical Stimulation Goals Pain;Tone   Ultrasound   Ultrasound Location Rt gluts/piriformis   Ultrasound Parameters combo, 100%, 1.0 mhz, 1.5w/cm2   Ultrasound Goals Pain   Manual Therapy   Joint Mobilization lumbar spine and sacrum PA mobs grade III   Soft tissue mobilization Rt gluts/piriformis, bilat QLs   Manual Traction Rt LE distraction                PT Education - 02/03/15 1418    Education provided Yes   Education Details pt to work on walking with his feet facing ahead.    Person(s) Educated Patient   Methods Explanation   Comprehension Verbalized understanding  PT Short Term Goals - 12/31/14 1301    PT SHORT TERM GOAL #1   Title Patient I in initial HEP 01/26/15   Time 4   Period Weeks   Status On-going   PT SHORT TERM GOAL #2   Title Improve HS flexibility to 75 degrees Rt and 80 degrees Lt with supine testing 01/26/15   Time 4   Period Weeks   Status On-going   PT SHORT TERM GOAL #3   Title Increase trunk extension to 75- 85% prone    Time 4   Period Weeks   Status On-going   PT SHORT TERM GOAL #4   Title Decrease pain to 2-3/10 50-75% of day   Time 4   Period Weeks   Status On-going           PT Long Term Goals - 12/31/14 1301    PT LONG TERM GOAL #1   Title Patient I with HEP for discharge 02/16/15   Time 8   Period Weeks   Status On-going   PT LONG TERM GOAL #2   Title Improve sitting time to 1 hour with min to no pain 02/16/15   Time 8   Period Weeks   Status On-going   PT LONG  TERM GOAL #3   Title Improve standing time to 30-45 min with min to no pain 02/16/15   Time 8   Period Weeks   Status On-going   PT LONG TERM GOAL #4   Title Increase activity level allowing patient to return to wood turning without pain 02/16/15   Time 8   Period Weeks   Status On-going               Plan - 02/03/15 1421    Clinical Impression Statement Pt reports overall he is feeling better however has some frustration with not improving faster.  He continues to have a leg length descrepancy with the Rt LE being shorter. It will correct slightly with muscle energy techniques.  He is also still very tight in the back and buttock musculature.  He reports continued numbness in the Rt foot.    Pt will benefit from skilled therapeutic intervention in order to improve on the following deficits Pain;Decreased range of motion;Improper body mechanics;Postural dysfunction;Abnormal gait;Decreased activity tolerance   Rehab Potential Good   PT Frequency 2x / week   PT Duration 8 weeks   PT Treatment/Interventions Patient/family education;ADLs/Self Care Home Management;Therapeutic exercise;Therapeutic activities;Neuromuscular re-education;Cryotherapy;Electrical Stimulation;Iontophoresis 4mg /ml Dexamethasone;Moist Heat;Traction;Ultrasound;Manual techniques   PT Next Visit Plan Pt to bring in his home TENs for instruction, goes to [facility] on Tuesday Sept 6th   Consulted and Agree with Plan of Care Patient        Problem List Patient Active Problem List   Diagnosis Date Noted  . HLD (hyperlipidemia) 09/02/2014  . ADD (attention deficit disorder) 09/02/2014  . Dyspnea 09/02/2014  . Right lumbar radiculopathy 05/12/2014  . Travel advice encounter 05/12/2014  . Blurry vision, right eye 02/19/2014  . Male hypogonadism 02/05/2014  . Insomnia 02/03/2014  . Annual physical exam 02/03/2014  . Allergic rhinitis 02/03/2014  . Retrocalcaneal bursitis 01/20/2014  . Left ankle pain 01/20/2014  .  Osteoarthritis of right hip 01/20/2014  . Coronary artery disease   . Dyslipidemia   . Hypertension     Jeral Pinch PT 02/03/2015, 2:23 PM  [facility] [place], [place], [address] Phone: [phone]   Fax:  [phone]

## 2015-02-09 ENCOUNTER — Ambulatory Visit (INDEPENDENT_AMBULATORY_CARE_PROVIDER_SITE_OTHER): Payer: BLUE CROSS/BLUE SHIELD | Admitting: Rehabilitative and Restorative Service Providers"

## 2015-02-09 ENCOUNTER — Ambulatory Visit (INDEPENDENT_AMBULATORY_CARE_PROVIDER_SITE_OTHER): Payer: BLUE CROSS/BLUE SHIELD

## 2015-02-09 ENCOUNTER — Ambulatory Visit (INDEPENDENT_AMBULATORY_CARE_PROVIDER_SITE_OTHER): Payer: BLUE CROSS/BLUE SHIELD | Admitting: Sports Medicine

## 2015-02-09 ENCOUNTER — Encounter: Payer: Self-pay | Admitting: Sports Medicine

## 2015-02-09 VITALS — BP 90/61 | HR 73 | Ht 70.0 in | Wt 223.0 lb

## 2015-02-09 DIAGNOSIS — M25551 Pain in right hip: Secondary | ICD-10-CM

## 2015-02-09 DIAGNOSIS — M169 Osteoarthritis of hip, unspecified: Secondary | ICD-10-CM

## 2015-02-09 DIAGNOSIS — M623 Immobility syndrome (paraplegic): Secondary | ICD-10-CM

## 2015-02-09 DIAGNOSIS — M24159 Other articular cartilage disorders, unspecified hip: Secondary | ICD-10-CM

## 2015-02-09 DIAGNOSIS — M1611 Unilateral primary osteoarthritis, right hip: Secondary | ICD-10-CM

## 2015-02-09 DIAGNOSIS — M545 Low back pain, unspecified: Secondary | ICD-10-CM

## 2015-02-09 DIAGNOSIS — Z7409 Other reduced mobility: Secondary | ICD-10-CM | POA: Diagnosis not present

## 2015-02-09 DIAGNOSIS — M2560 Stiffness of unspecified joint, not elsewhere classified: Secondary | ICD-10-CM

## 2015-02-09 NOTE — Progress Notes (Signed)
  Subjective:    CC:  Follow-up  HPI: Right hip pain: post accident, he did have an element of osteoarthritis but now has significant mechanical symptoms suggestive of a labral tear, at this point he has failed over one month of formal physical therapy, NSAIDs, and intra-articular injection.  Right lumbar radiculopathy: Persistent, not yet ready to consider an additional epidural.  Past medical history, Surgical history, Family history not pertinant except as noted below, Social history, Allergies, and medications have been entered into the medical record, reviewed, and no changes needed.   Review of Systems: No fevers, chills, night sweats, weight loss, chest pain, or shortness of breath.   Objective:    General: Well Developed, well nourished, and in no acute distress.  Neuro: Alert and oriented x3, extra-ocular muscles intact, sensation grossly intact.  HEENT: Normocephalic, atraumatic, pupils equal round reactive to light, neck supple, no masses, no lymphadenopathy, thyroid nonpalpable.  Skin: Warm and dry, no rashes. Cardiac: Regular rate and rhythm, no murmurs rubs or gallops, no lower extremity edema.  Respiratory: Clear to auscultation bilaterally. Not using accessory muscles, speaking in full sentences.  Impression and Recommendations:    I spent 40 minutes with this patient, greater than 50% was face-to-face time counseling regarding the above diagnoses

## 2015-02-09 NOTE — Progress Notes (Signed)
  Procedure: Real-time Ultrasound Guided gadolinium contrast injection of right femoral acetabular joint Device: GE Logiq E  Verbal informed consent obtained.  Time-out conducted.  Noted no overlying erythema, induration, or other signs of local infection.  Skin prepped in a sterile fashion.  Local anesthesia: Topical Ethyl chloride.  With sterile technique and under real time ultrasound guidance:   Using a 22-gauge spinal needle advanced toward the femoral head/neck junction, the capsule was entered and I injected 1 mL kenalog 40, 5 mL lidocaine, syringe switched and 0.1 mL gadolinium injected, syringe again switched and 10 mL sterile saline injected. Joint visualized and capsule seen distending confirming intra-articular placement of contrast material and medication. Completed without difficulty  Advised to call if fevers/chills, erythema, induration, drainage, or persistent bleeding.  Images permanently stored and available for review in the ultrasound unit.  Impression: Technically successful ultrasound guided gadolinium contrast injection for MR arthrography.  Please see separate MR arthrogram report.

## 2015-02-09 NOTE — Therapy (Addendum)
[place], [place], [address] Phone: [phone]   Fax:  [phone]  Physical Therapy Treatment  Patient Details  Name: Nathan Sampson MRN: [id number] Date of Birth: 04-20-1956 Referring Provider:  Silverio Decamp,*  Encounter Date: 02/09/2015      PT End of Session - 02/09/15 0929    Visit Number 10   Number of Visits 16   Date for PT Re-Evaluation 02/16/15   PT Start Time 0803   PT Stop Time 1497   PT Time Calculation (min) 56 min   Activity Tolerance Patient tolerated treatment well      Past Medical History  Diagnosis Date  . Coronary artery disease     Status post stenting of the mid LAD and distal right coronary with drug-eluting stents in July 2009.  He is Asymptomatic. He had a strss echo in September 2009 which was normal.  . Dyslipidemia   . Hypertension     Poorly Controlled. Exacerbated by sedentary lifestyle, diet and weight gain, and stress.  . OCD (obsessive compulsive disorder)     Past Surgical History  Procedure Laterality Date  . Arthroscopic repair acl    . Cardiovascular stress test  11-29-2009    EF 57%  . Hernia repair      AGE 59 - HERNIA REPAIR  . Surgery for sleep apnea      ABOUT 10 YRS AGO-? PALATOPLASTY AND T/A-SURGERY WAS DONE IN [place]    There were no vitals filed for this visit.  Visit Diagnosis:  Bilateral low back pain without sciatica  Pain, hip, right  Stiffness due to immobility  Impaired functional mobility and endurance      Subjective Assessment - 02/09/15 0813    Subjective Patient reports that the Lt side is some improved. He continues to have pain and tightness in Rt piriformis. Numbness continues in Rt toes. He still has the popping in the R hip.    Currently in Pain? Yes   Pain Score 1    Pain Location Hip   Pain Orientation Right;Anterior;Posterior   Pain Descriptors / Indicators Throbbing   Pain Type Acute pain    Pain Onset More than a month ago   Pain Frequency Intermittent   Aggravating Factors  Lying on Rt side or back to sleep   Pain Relieving Factors changing positions; stretching; meds            [facility] PT Assessment - 02/09/15 0001    AROM   Lumbar Flexion 85%   Lumbar Extension 60%   Lumbar - Right Side Bend 85%   Lumbar - Left Side Bend 80%   Strength   Overall Strength Comments WFL's bilat LE's pain with resistive testing Rt hip flex/ext   Palpation   SI assessment  Tight Lt > Rt   Palpation comment tight Lt QL; Rt piriformis                     [facility] Adult PT Treatment/Exercise - 02/09/15 0001    Lumbar Exercises: Stretches   Piriformis Stretch 3 reps;30 seconds   Piriformis Stretch Limitations passive hip flexor and saratorsis stretch    Lumbar Exercises: Supine   Ab Set 10 reps   AB Set Limitations working to incoporate 3 part core into all exercises   Other Supine Lumbar Exercises resisted hio flexion in ~90 deg hip flexioin in supine - 5 reps 2 sets with patient providing resistance  himself on second set   Other Supine Lumbar Exercises alternate knee drop/windshield wiper supine   Cryotherapy   Number Minutes Cryotherapy 15 Minutes   Cryotherapy Location Lumbar Spine   Type of Cryotherapy Ice pack   Electrical Stimulation   Electrical Stimulation Location Lt LB/Rt piriformis   Electrical Stimulation Action TENS unit   Electrical Stimulation Parameters to tolerance   Electrical Stimulation Goals Pain   Ultrasound   Ultrasound Location Rt piriformis/gluts   Ultrasound Parameters combo 100% 1.o mHz; 1.5 w/cm2   Ultrasound Goals Pain   Manual Therapy   Joint Mobilization lumbar spine and sacrum PA mobs grade III   Soft tissue mobilization Rt gluts/piriformis, bilat QLs   Manual Traction Rt LE distraction                PT Education - 02/09/15 0928    Education provided Yes   Education Details continue work on stretching; stabilization;  extension for discogenic symptoms    Person(s) Educated Patient   Methods Explanation   Comprehension Verbalized understanding          PT Short Term Goals - 12/31/14 1301    PT SHORT TERM GOAL #1   Title Patient I in initial HEP 01/26/15   Time 4   Period Weeks   Status On-going   PT SHORT TERM GOAL #2   Title Improve HS flexibility to 75 degrees Rt and 80 degrees Lt with supine testing 01/26/15   Time 4   Period Weeks   Status On-going   PT SHORT TERM GOAL #3   Title Increase trunk extension to 75- 85% prone    Time 4   Period Weeks   Status On-going   PT SHORT TERM GOAL #4   Title Decrease pain to 2-3/10 50-75% of day   Time 4   Period Weeks   Status On-going           PT Long Term Goals - 12/31/14 1301    PT LONG TERM GOAL #1   Title Patient I with HEP for discharge 02/16/15   Time 8   Period Weeks   Status On-going   PT LONG TERM GOAL #2   Title Improve sitting time to 1 hour with min to no pain 02/16/15   Time 8   Period Weeks   Status On-going   PT LONG TERM GOAL #3   Title Improve standing time to 30-45 min with min to no pain 02/16/15   Time 8   Period Weeks   Status On-going   PT LONG TERM GOAL #4   Title Increase activity level allowing patient to return to wood turning without pain 02/16/15   Time 8   Period Weeks   Status On-going               Plan - 02/09/15 0930    Clinical Impression Statement Some improvement but continues to have pain in Rt hip - primarily at night. He has leg length descrepancy Rt LE shorter than Lt which is corrected slightly with muscle energy techniques. There is continued muscular tightness through bilat hips; Lt QL/lats and Rt piriformis/hip abductors. Numbness iin Rt toes is unchanged.    Pt will benefit from skilled therapeutic intervention in order to improve on the following deficits Pain;Decreased range of motion;Improper body mechanics;Postural dysfunction;Abnormal gait;Decreased activity tolerance    Rehab Potential Good   PT Frequency 2x / week   PT Duration 8 weeks   PT Treatment/Interventions Patient/family  education;ADLs/Self Care Home Management;Therapeutic exercise;Therapeutic activities;Neuromuscular re-education;Cryotherapy;Electrical Stimulation;Iontophoresis 43m/ml Dexamethasone;Moist Heat;Traction;Ultrasound;Manual techniques   PT Next Visit Plan Continue efforts to decrease muscular tightness and improve stability through spine and LE's. Patient will use TENS unit for pain management at home.    PT Home Exercise Plan HEP, body mechanics; TENS unit for pain management   Consulted and Agree with Plan of Care Patient        Problem List Patient Active Problem List   Diagnosis Date Noted  . HLD (hyperlipidemia) 09/02/2014  . ADD (attention deficit disorder) 09/02/2014  . Dyspnea 09/02/2014  . Right lumbar radiculopathy 05/12/2014  . Travel advice encounter 05/12/2014  . Blurry vision, right eye 02/19/2014  . Male hypogonadism 02/05/2014  . Insomnia 02/03/2014  . Annual physical exam 02/03/2014  . Allergic rhinitis 02/03/2014  . Retrocalcaneal bursitis 01/20/2014  . Left ankle pain 01/20/2014  . Osteoarthritis of right hip 01/20/2014  . Coronary artery disease   . Dyslipidemia   . Hypertension     Nathan Sampson PNilda SimmerPT, MPH 02/09/2015, 10:42 AM  [facility] [address]Phone: [phone]  Fax:  [phone]  PHYSICAL THERAPY DISCHARGE SUMMARY  Visits from Start of Care: 10  Current functional level related to goals / functional outcomes: Some improvement with PT however pain continues. Pt will undergo surgery.   Remaining deficits: Pain and functional limitations   Education / Equipment: HEP/theraband  Plan: Patient agrees to discharge.  Patient goals were partially met. Patient is being discharged due to a change in medical status.  ?????     Cypress Hinkson P. HHelene KelpPT,  MPH 03/03/2015 7:11 AM
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
## 2015-02-18 ENCOUNTER — Encounter: Payer: Self-pay | Admitting: Sports Medicine

## 2015-02-18 ENCOUNTER — Ambulatory Visit (INDEPENDENT_AMBULATORY_CARE_PROVIDER_SITE_OTHER): Payer: BLUE CROSS/BLUE SHIELD | Admitting: Sports Medicine

## 2015-02-18 VITALS — BP 125/78 | HR 89 | Ht 70.0 in | Wt 214.0 lb

## 2015-02-18 DIAGNOSIS — M169 Osteoarthritis of hip, unspecified: Secondary | ICD-10-CM | POA: Diagnosis not present

## 2015-02-18 DIAGNOSIS — M24159 Other articular cartilage disorders, unspecified hip: Secondary | ICD-10-CM

## 2015-02-18 DIAGNOSIS — M5416 Radiculopathy, lumbar region: Secondary | ICD-10-CM | POA: Diagnosis not present

## 2015-02-18 DIAGNOSIS — M25572 Pain in left ankle and joints of left foot: Secondary | ICD-10-CM | POA: Diagnosis not present

## 2015-02-18 MED ORDER — HYDROCODONE-ACETAMINOPHEN 7.5-325 MG PO TABS: 1.0000 | ORAL_TABLET | Freq: Three times a day (TID) | ORAL | Status: DC | PRN
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
## 2015-02-18 NOTE — Assessment & Plan Note (Signed)
Nathan Sampson continues to have right-sided L5 radiculopathy with paresthesias in the middle 3 toes on his right foot. On review of his recent MRI I do see extra foraminal and foraminal contact of the L5-S1 disc to the right sided L5 nerve root. For this reason before surgical intervention we are going to proceed with a diagnostic and therapeutic right-sided L5-S1 transforaminal epidural.

## 2015-02-26 ENCOUNTER — Ambulatory Visit
Admission: RE | Admit: 2015-02-26 | Discharge: 2015-02-26 | Disposition: A | Discharge status: Home / Self Care | Payer: BLUE CROSS/BLUE SHIELD | Source: Ambulatory Visit | Attending: Sports Medicine | Admitting: Sports Medicine

## 2015-02-26 MED ORDER — METHYLPREDNISOLONE ACETATE 40 MG/ML INJ SUSP (RADIOLOG
120.0000 mg | Freq: Once | INTRAMUSCULAR | Status: AC
  Administered 2015-02-26: 120 mg via EPIDURAL

## 2015-02-26 MED ORDER — IOHEXOL 180 MG/ML  SOLN
1.0000 mL | Freq: Once | INTRAMUSCULAR | Status: DC | PRN
  Administered 2015-02-26: 1 mL via EPIDURAL

## 2015-02-26 NOTE — Discharge Instructions (Signed)

## 2015-02-27 ENCOUNTER — Encounter: Payer: Self-pay | Admitting: Sports Medicine

## 2015-02-27 DIAGNOSIS — I10 Essential (primary) hypertension: Secondary | ICD-10-CM

## 2015-03-03 MED ORDER — RAMIPRIL 2.5 MG PO CAPS: 2.5000 mg | ORAL_CAPSULE | Freq: Two times a day (BID) | ORAL | Status: DC

## 2015-03-13 ENCOUNTER — Other Ambulatory Visit: Payer: Self-pay | Admitting: Sports Medicine

## 2015-04-22 ENCOUNTER — Other Ambulatory Visit: Payer: Self-pay | Admitting: Sports Medicine

## 2015-05-04 ENCOUNTER — Other Ambulatory Visit: Payer: Self-pay | Admitting: Sports Medicine

## 2015-05-16 ENCOUNTER — Other Ambulatory Visit: Payer: Self-pay | Admitting: Sports Medicine

## 2015-05-21 ENCOUNTER — Other Ambulatory Visit: Payer: Self-pay | Admitting: Sports Medicine

## 2015-05-24 ENCOUNTER — Other Ambulatory Visit: Payer: Self-pay | Admitting: Orthopedic Surgery

## 2015-06-04 ENCOUNTER — Encounter (HOSPITAL_COMMUNITY)
Admission: RE | Admit: 2015-06-04 | Discharge: 2015-06-04 | Disposition: A | Discharge status: Home / Self Care | Payer: 59 | Source: Ambulatory Visit | Attending: Orthopedic Surgery | Admitting: Orthopedic Surgery

## 2015-06-04 ENCOUNTER — Ambulatory Visit (HOSPITAL_COMMUNITY)
Admission: RE | Admit: 2015-06-04 | Discharge: 2015-06-04 | Disposition: A | Discharge status: Home / Self Care | Payer: 59 | Source: Ambulatory Visit | Attending: Orthopedic Surgery | Admitting: Orthopedic Surgery

## 2015-06-04 ENCOUNTER — Encounter (HOSPITAL_COMMUNITY): Payer: Self-pay

## 2015-06-04 ENCOUNTER — Telehealth: Payer: Self-pay | Admitting: Cardiology

## 2015-06-04 DIAGNOSIS — Z01812 Encounter for preprocedural laboratory examination: Secondary | ICD-10-CM | POA: Diagnosis not present

## 2015-06-04 DIAGNOSIS — Z01818 Encounter for other preprocedural examination: Secondary | ICD-10-CM | POA: Diagnosis not present

## 2015-06-04 DIAGNOSIS — R05 Cough: Secondary | ICD-10-CM | POA: Diagnosis not present

## 2015-06-04 DIAGNOSIS — J984 Other disorders of lung: Secondary | ICD-10-CM | POA: Diagnosis not present

## 2015-06-04 HISTORY — DX: Unspecified osteoarthritis, unspecified site: M19.90

## 2015-06-04 LAB — URINALYSIS, ROUTINE W REFLEX MICROSCOPIC
GLUCOSE, UA: NEGATIVE mg/dL
Hgb urine dipstick: NEGATIVE
KETONES UR: NEGATIVE mg/dL
Leukocytes, UA: NEGATIVE
NITRITE: NEGATIVE
PH: 5 (ref 5.0–8.0)
Protein, ur: NEGATIVE mg/dL

## 2015-06-04 LAB — BASIC METABOLIC PANEL
Anion gap: 10 (ref 5–15)
BUN: 15 mg/dL (ref 6–20)
CALCIUM: 9.1 mg/dL (ref 8.9–10.3)
CHLORIDE: 104 mmol/L (ref 101–111)
CO2: 26 mmol/L (ref 22–32)
CREATININE: 1.09 mg/dL (ref 0.61–1.24)
Glucose, Bld: 105 mg/dL — ABNORMAL HIGH (ref 65–99)
Potassium: 4.3 mmol/L (ref 3.5–5.1)
SODIUM: 140 mmol/L (ref 135–145)

## 2015-06-04 LAB — ABO/RH: ABO/RH(D): O POS

## 2015-06-04 LAB — TYPE AND SCREEN
ABO/RH(D): O POS
ANTIBODY SCREEN: NEGATIVE

## 2015-06-04 LAB — CBC WITH DIFFERENTIAL/PLATELET
BASOS ABS: 0.1 10*3/uL (ref 0.0–0.1)
BASOS PCT: 0 %
EOS ABS: 0.3 10*3/uL (ref 0.0–0.7)
EOS PCT: 2 %
HCT: 44.7 % (ref 39.0–52.0)
HEMOGLOBIN: 15.4 g/dL (ref 13.0–17.0)
LYMPHS ABS: 2 10*3/uL (ref 0.7–4.0)
Lymphocytes Relative: 18 %
MCH: 30.5 pg (ref 26.0–34.0)
MCHC: 34.5 g/dL (ref 30.0–36.0)
MCV: 88.5 fL (ref 78.0–100.0)
Monocytes Absolute: 1.1 10*3/uL — ABNORMAL HIGH (ref 0.1–1.0)
Monocytes Relative: 10 %
NEUTROS PCT: 70 %
Neutro Abs: 7.7 10*3/uL (ref 1.7–7.7)
PLATELETS: 170 10*3/uL (ref 150–400)
RBC: 5.05 MIL/uL (ref 4.22–5.81)
RDW: 12.9 % (ref 11.5–15.5)
WBC: 11.1 10*3/uL — AB (ref 4.0–10.5)

## 2015-06-04 LAB — PROTIME-INR
INR: 1 (ref 0.00–1.49)
PROTHROMBIN TIME: 13.4 s (ref 11.6–15.2)

## 2015-06-04 LAB — SURGICAL PCR SCREEN
MRSA, PCR: NEGATIVE
STAPHYLOCOCCUS AUREUS: POSITIVE — AB

## 2015-06-04 LAB — APTT: APTT: 30 s (ref 24–37)

## 2015-06-04 NOTE — Pre-Procedure Instructions (Signed)
Nathan Sampson  06/04/2015      [facility] - [address], [place] [address] Phone: [phone] Fax: [phone]  [id number] - [place] [address], [address] [place] [address] Phone: [phone] Fax: [phone]    Your procedure is scheduled on Wednesday, Jan. 11th.   Report to [facility] Admitting at 10:45 AM             (Posted surgical time is 12:45 - 3:34pm)  Call this number if you have problems the morning of surgery:  [phone]   Remember:  Do not eat food or drink liquids after midnight Tuesday.   Take these medicines the morning of surgery with A SIP OF WATER : Coreg, Hydrocodone             (STOP taking any blood thinners, herbal supplements, vitamins, anti-inflammatories 4-5 days prior to surgery.)   Do not wear jewelry - no rings or watches.  Do not wear lotions or colognes.    You may NOT wear deodorant the day of surgery.             Men may shave face and neck.   Do not bring valuables to the hospital.  [facility] is not responsible for any belongings or valuables.  Contacts, dentures or bridgework may not be worn into surgery.  Leave your suitcase in the car.  After surgery it may be brought to your room.  For patients admitted to the hospital, discharge time will be determined by your treatment team.   Name and phone number of your driver:     Please read over the following fact sheets that you were given. Pain Booklet, Coughing and Deep Breathing, Blood Transfusion Information, MRSA Information and Surgical Site Infection Prevention

## 2015-06-04 NOTE — Progress Notes (Signed)
Had heart cath with 2 stents placed in 2009.  He is not sure where it was done.  Wife thinks [place], he thinks here.  Does see Dr. P Martinique, however.   Had a stress test a yr ago -- "normal".  LOV to [place] was 10 mths ago. Not having any chest discomfort or issues since his stenting. PCP is [facility].    Has been having a head cold which has gone into his chest.  Afebrile.  But coughing up slightly yellow-brown secretion..   States the "head cold has moved down".

## 2015-06-04 NOTE — Telephone Encounter (Signed)
SPOKE TO PATIENT  SURGERY HAS BEEN SCHEDULE FOR 06/16/15 WITH DR Mayer Camel  PATIENT THOUGHT DR Mayer Camel OFFICE HAD CONTACT DR Martinique.   RN INFORMED PATIENT NO INFORMATION WAS DOCUMENTED.  WILL DEFER TO DR Martinique AND WILL CONTACT HIM BACK PATIENT AWARE

## 2015-06-04 NOTE — Telephone Encounter (Signed)
Pt is going to have a hip replacement. He wants to know if he needs to see Dr Martinique before surgery? He also wants to know if there are any special instrutctions for him?

## 2015-06-05 NOTE — Telephone Encounter (Signed)
He is cleared for hip surgery from a cardiac standpoint only. Normal stress test earlier this year.   Peter Martinique MD, [facility]

## 2015-06-08 NOTE — Progress Notes (Signed)
Anesthesia Chart Review:  Pt is 60 year old male scheduled for R total hip arthroplasty anterior approach on 06/16/2015 with Dr. Mayer Camel.   Cardiologist is Dr. Peter Martinique, last office visit 09/02/14  PMH includes:  CAD (s/p DES to mid LAD and distal RCA 2009), HTN, hyperlipidemia. Current smoker. BMI 32. S/p shoulder arthroscopy 10/12/11.   Medications include: ASA, lipitor, carvedilol, lasix, ramipril.   Preoperative labs reviewed.    Chest x-ray 06/04/15 reviewed. Lingular scarring. No active disease.   Exercise tolerance test 09/30/14: ETT with good exercise tolerance (10:21); no chest pain; normal BP response; no ST changes; negative adequate ETT  Nuclear stress test 03/19/12: Normal myoview study. EF 60%.   Stress echo 02/25/08: Normal exercise echo stress test. Excellent exercise tolerance.   Pt has cardiac clearance for surgery from Dr. Martinique (see Epic telephone encounter dated 06/05/15).   If no changes, I anticipate pt can proceed with surgery as scheduled.   Willeen Cass, FNP-BC [facility]/Anesthesiology Phone: [phone] 06/08/2015 3:28 PM'

## 2015-06-09 NOTE — Telephone Encounter (Signed)
Returned call to patient Dr.Jordan advised ok to have hip surgery.Note faxed to Dr.Rowan's office.

## 2015-06-15 MED ORDER — DEXTROSE-NACL 5-0.45 % IV SOLN: INTRAVENOUS | Status: DC

## 2015-06-15 MED ORDER — CHLORHEXIDINE GLUCONATE 4 % EX LIQD: 60.0000 mL | Freq: Once | CUTANEOUS | Status: DC

## 2015-06-15 MED ORDER — VANCOMYCIN HCL 10 G IV SOLR
1500.0000 mg | INTRAVENOUS | Status: AC
  Administered 2015-06-16: 1500 mg via INTRAVENOUS
  Filled 2015-06-15: qty 1500

## 2015-06-15 NOTE — H&P (Signed)
TOTAL HIP ADMISSION H&P  Patient is admitted for right total hip arthroplasty.  Subjective:  Chief Complaint: right hip pain  HPI: Nathan Sampson, 60 y.o. male, has a history of pain and functional disability in the right hip(s) due to trauma and patient has failed non-surgical conservative treatments for greater than 12 weeks to include NSAID's and/or analgesics, corticosteriod injections, flexibility and strengthening excercises, weight reduction as appropriate and activity modification.  Onset of symptoms was abrupt starting 10 years ago with rapidlly worsening course since that time.The patient noted no past surgery on the right hip(s).  Patient currently rates pain in the right hip at 10 out of 10 with activity. Patient has night pain, worsening of pain with activity and weight bearing, pain that interfers with activities of daily living and pain with passive range of motion. Patient has evidence of joint space narrowing by imaging studies. This condition presents safety issues increasing the risk of falls.  There is no current active infection.  Patient Active Problem List   Diagnosis Date Noted  . HLD (hyperlipidemia) 09/02/2014  . ADD (attention deficit disorder) 09/02/2014  . Dyspnea 09/02/2014  . Right lumbar radiculopathy 05/12/2014  . Travel advice encounter 05/12/2014  . Blurry vision, right eye 02/19/2014  . Male hypogonadism 02/05/2014  . Insomnia 02/03/2014  . Annual physical exam 02/03/2014  . Allergic rhinitis 02/03/2014  . Retrocalcaneal bursitis 01/20/2014  . Left ankle pain 01/20/2014  . Labral tear of hip, degenerative 01/20/2014  . Coronary artery disease   . Dyslipidemia   . Hypertension    Past Medical History  Diagnosis Date  . Coronary artery disease     Status post stenting of the mid LAD and distal right coronary with drug-eluting stents in July 2009.  He is Asymptomatic. He had a strss echo in September 2009 which was normal.  . Dyslipidemia   .  Hypertension     Poorly Controlled. Exacerbated by sedentary lifestyle, diet and weight gain, and stress.  . OCD (obsessive compulsive disorder)   . Arthritis     Past Surgical History  Procedure Laterality Date  . Arthroscopic repair acl    . Cardiovascular stress test  11-29-2009    EF 57%  . Hernia repair      AGE 20 - HERNIA REPAIR  . Surgery for sleep apnea      ABOUT 10 YRS AGO-? PALATOPLASTY AND T/A-SURGERY WAS DONE IN [place]  . Cardiac catheterization      2 stents placed in 11/2007  . Left rotator cuff      2013  . Tonsillectomy      UPP  . Eye surgery      lasik 16 yrs ago    No prescriptions prior to admission   Allergies  Allergen Reactions  . Penicillins Anaphylaxis    Has patient had a PCN reaction causing immediate rash, facial/tongue/throat swelling, SOB or lightheadedness with hypotension: Yes Has patient had a PCN reaction causing severe rash involving mucus membranes or skin necrosis: Yes Has patient had a PCN reaction that required hospitalization No Has patient had a PCN reaction occurring within the last 10 years: Yes If all of the above answers are "NO", then may proceed with Cephalosporin use.     Social History  Substance Use Topics  . Smoking status: Light Tobacco Smoker    Types: Cigars    Last Attempt to Quit: 06/05/2008  . Smokeless tobacco: Not on file  . Alcohol Use: 1.2 oz/week  2 Glasses of wine per week     Comment: occasionally    Family History  Problem Relation Age of Onset  . Diabetes Father      Review of Systems  Constitutional: Negative.   HENT: Negative.   Eyes: Negative.   Respiratory: Negative.   Cardiovascular:       HTN  Gastrointestinal: Negative.   Genitourinary: Negative.   Musculoskeletal: Positive for myalgias and joint pain.  Skin: Negative.   Neurological: Negative.   Endo/Heme/Allergies: Negative.   Psychiatric/Behavioral: Negative.     Objective:  Physical Exam  Constitutional: He is  oriented to person, place, and time. He appears well-developed and well-nourished.  HENT:  Head: Normocephalic and atraumatic.  Eyes: Pupils are equal, round, and reactive to light.  Neck: Normal range of motion. Neck supple.  Cardiovascular: Intact distal pulses.   Respiratory: Effort normal.  Musculoskeletal: He exhibits tenderness.  the patient's left hip has good strength good range of motion and no pain.  Patient's right hip has obvious irritability with internal rotation.  He has limited range of motion and has approximately 5-10 of internal rotation.  He has pain with palpation of the groin.  He mild lateral hip pain.  Increased pain with he'll bump on the right.  His calves are soft and nontender.  He is neurovascularly intact distally.  Neurological: He is alert and oriented to person, place, and time.  Skin: Skin is warm and dry.  Psychiatric: He has a normal mood and affect. His behavior is normal. Judgment and thought content normal.    Vital signs in last 24 hours:    Labs:   Estimated body mass index is 30.71 kg/(m^2) as calculated from the following:   Height as of 02/18/15: 5\' 10"  (1.778 m).   Weight as of 02/18/15: 97.07 kg (214 lb).   Imaging Review MRI of the right hip was reviewed and shows severe degenerative arthritis of the right hip.    Assessment/Plan:  End stage arthritis, right hip(s) and We believe the patient would benefit from an anterior approach.    The patient history, physical examination, clinical judgement of the provider and imaging studies are consistent with end stage degenerative joint disease of the right hip(s) and total hip arthroplasty is deemed medically necessary. The treatment options including medical management, injection therapy, arthroscopy and arthroplasty were discussed at length. The risks and benefits of total hip arthroplasty were presented and reviewed. The risks due to aseptic loosening, infection, stiffness,  dislocation/subluxation,  thromboembolic complications and other imponderables were discussed.  The patient acknowledged the explanation, agreed to proceed with the plan and consent was signed. Patient is being admitted for inpatient treatment for surgery, pain control, PT, OT, prophylactic antibiotics, VTE prophylaxis, progressive ambulation and ADL's and discharge planning.The patient is planning to be discharged home with home health services

## 2015-06-16 ENCOUNTER — Inpatient Hospital Stay (HOSPITAL_COMMUNITY): Payer: 59 | Admitting: Vascular Surgery

## 2015-06-16 ENCOUNTER — Inpatient Hospital Stay (HOSPITAL_COMMUNITY): Payer: 59

## 2015-06-16 ENCOUNTER — Encounter (HOSPITAL_COMMUNITY)
Admission: RE | Disposition: A | Discharge status: Home Health | Payer: Self-pay | Source: Ambulatory Visit | Attending: Orthopedic Surgery

## 2015-06-16 ENCOUNTER — Inpatient Hospital Stay (HOSPITAL_COMMUNITY)
Admission: RE | Admit: 2015-06-16 | Discharge: 2015-06-18 | DRG: 470 | Disposition: A | Discharge status: Home Health | Payer: 59 | Source: Ambulatory Visit | Attending: Orthopedic Surgery | Admitting: Orthopedic Surgery

## 2015-06-16 ENCOUNTER — Encounter (HOSPITAL_COMMUNITY): Payer: Self-pay | Admitting: *Deleted

## 2015-06-16 ENCOUNTER — Inpatient Hospital Stay (HOSPITAL_COMMUNITY): Payer: 59 | Admitting: Anesthesiology

## 2015-06-16 DIAGNOSIS — Z419 Encounter for procedure for purposes other than remedying health state, unspecified: Secondary | ICD-10-CM

## 2015-06-16 DIAGNOSIS — F429 Obsessive-compulsive disorder, unspecified: Secondary | ICD-10-CM | POA: Diagnosis present

## 2015-06-16 DIAGNOSIS — F17210 Nicotine dependence, cigarettes, uncomplicated: Secondary | ICD-10-CM | POA: Diagnosis present

## 2015-06-16 DIAGNOSIS — D62 Acute posthemorrhagic anemia: Secondary | ICD-10-CM | POA: Diagnosis not present

## 2015-06-16 DIAGNOSIS — Z880 Allergy status to penicillin: Secondary | ICD-10-CM

## 2015-06-16 DIAGNOSIS — M1611 Unilateral primary osteoarthritis, right hip: Secondary | ICD-10-CM | POA: Diagnosis present

## 2015-06-16 DIAGNOSIS — Z955 Presence of coronary angioplasty implant and graft: Secondary | ICD-10-CM

## 2015-06-16 DIAGNOSIS — I2510 Atherosclerotic heart disease of native coronary artery without angina pectoris: Secondary | ICD-10-CM | POA: Diagnosis present

## 2015-06-16 DIAGNOSIS — Z96649 Presence of unspecified artificial hip joint: Secondary | ICD-10-CM

## 2015-06-16 DIAGNOSIS — E785 Hyperlipidemia, unspecified: Secondary | ICD-10-CM | POA: Diagnosis present

## 2015-06-16 DIAGNOSIS — I10 Essential (primary) hypertension: Secondary | ICD-10-CM | POA: Diagnosis present

## 2015-06-16 HISTORY — PX: TOTAL HIP ARTHROPLASTY: SHX124

## 2015-06-16 SURGERY — ARTHROPLASTY, HIP, TOTAL, ANTERIOR APPROACH
Anesthesia: Spinal | Site: Hip | Laterality: Right

## 2015-06-16 MED ORDER — ONDANSETRON HCL 4 MG/2ML IJ SOLN
INTRAMUSCULAR | Status: DC | PRN
  Administered 2015-06-16: 4 mg via INTRAVENOUS

## 2015-06-16 MED ORDER — 0.9 % SODIUM CHLORIDE (POUR BTL) OPTIME
TOPICAL | Status: DC | PRN
  Administered 2015-06-16: 1000 mL

## 2015-06-16 MED ORDER — METHOCARBAMOL 500 MG PO TABS
500.0000 mg | ORAL_TABLET | Freq: Four times a day (QID) | ORAL | Status: DC | PRN
  Administered 2015-06-16 – 2015-06-18 (×6): 500 mg via ORAL
  Filled 2015-06-16 (×5): qty 1

## 2015-06-16 MED ORDER — HYDROMORPHONE HCL 1 MG/ML IJ SOLN
1.0000 mg | INTRAMUSCULAR | Status: DC | PRN
  Administered 2015-06-16 – 2015-06-17 (×2): 1 mg via INTRAVENOUS
  Filled 2015-06-16: qty 1

## 2015-06-16 MED ORDER — OXYCODONE HCL 5 MG PO TABS
ORAL_TABLET | ORAL | Status: AC
  Filled 2015-06-16: qty 2

## 2015-06-16 MED ORDER — ONDANSETRON HCL 4 MG PO TABS: 4.0000 mg | ORAL_TABLET | Freq: Four times a day (QID) | ORAL | Status: DC | PRN

## 2015-06-16 MED ORDER — HYDROMORPHONE HCL 1 MG/ML IJ SOLN
INTRAMUSCULAR | Status: AC
  Administered 2015-06-16: 0.5 mg via INTRAVENOUS
  Filled 2015-06-16: qty 1

## 2015-06-16 MED ORDER — LACTATED RINGERS IV SOLN
INTRAVENOUS | Status: DC
  Administered 2015-06-16 (×2): via INTRAVENOUS

## 2015-06-16 MED ORDER — METHOCARBAMOL 500 MG PO TABS: 500.0000 mg | ORAL_TABLET | Freq: Two times a day (BID) | ORAL | Status: DC

## 2015-06-16 MED ORDER — RAMIPRIL 2.5 MG PO CAPS
2.5000 mg | ORAL_CAPSULE | Freq: Two times a day (BID) | ORAL | Status: DC
  Administered 2015-06-17 – 2015-06-18 (×3): 2.5 mg via ORAL
  Filled 2015-06-16 (×5): qty 1

## 2015-06-16 MED ORDER — METHOCARBAMOL 1000 MG/10ML IJ SOLN
500.0000 mg | Freq: Four times a day (QID) | INTRAVENOUS | Status: DC | PRN
  Filled 2015-06-16: qty 5

## 2015-06-16 MED ORDER — HYDROCODONE-ACETAMINOPHEN 5-325 MG PO TABS
ORAL_TABLET | ORAL | Status: AC
  Administered 2015-06-16: 1
  Filled 2015-06-16: qty 1

## 2015-06-16 MED ORDER — FLEET ENEMA 7-19 GM/118ML RE ENEM: 1.0000 | ENEMA | Freq: Once | RECTAL | Status: DC | PRN

## 2015-06-16 MED ORDER — OXYCODONE HCL 5 MG PO TABS
5.0000 mg | ORAL_TABLET | ORAL | Status: DC | PRN
  Administered 2015-06-16 – 2015-06-18 (×12): 10 mg via ORAL
  Filled 2015-06-16 (×10): qty 2

## 2015-06-16 MED ORDER — HYDROCODONE-ACETAMINOPHEN 7.5-325 MG PO TABS: 1.0000 | ORAL_TABLET | Freq: Once | ORAL | Status: DC | PRN

## 2015-06-16 MED ORDER — OXYCODONE-ACETAMINOPHEN 5-325 MG PO TABS: 1.0000 | ORAL_TABLET | ORAL | Status: DC | PRN

## 2015-06-16 MED ORDER — MENTHOL 3 MG MT LOZG: 1.0000 | LOZENGE | OROMUCOSAL | Status: DC | PRN

## 2015-06-16 MED ORDER — PROPOFOL 10 MG/ML IV BOLUS
INTRAVENOUS | Status: DC | PRN
  Administered 2015-06-16: 10 mg via INTRAVENOUS
  Administered 2015-06-16 (×2): 20 mg via INTRAVENOUS

## 2015-06-16 MED ORDER — BUPIVACAINE HCL (PF) 0.75 % IJ SOLN
INTRAMUSCULAR | Status: DC | PRN
  Administered 2015-06-16: 2 mL via INTRATHECAL

## 2015-06-16 MED ORDER — CARVEDILOL 12.5 MG PO TABS
12.5000 mg | ORAL_TABLET | Freq: Two times a day (BID) | ORAL | Status: DC
  Administered 2015-06-17 (×2): 12.5 mg via ORAL
  Filled 2015-06-16 (×3): qty 1

## 2015-06-16 MED ORDER — PHENOL 1.4 % MT LIQD: 1.0000 | OROMUCOSAL | Status: DC | PRN

## 2015-06-16 MED ORDER — METHOCARBAMOL 500 MG PO TABS
ORAL_TABLET | ORAL | Status: AC
  Administered 2015-06-16: 500 mg via ORAL
  Filled 2015-06-16: qty 1

## 2015-06-16 MED ORDER — PROPOFOL 10 MG/ML IV BOLUS
INTRAVENOUS | Status: AC
  Filled 2015-06-16: qty 20

## 2015-06-16 MED ORDER — ALUM & MAG HYDROXIDE-SIMETH 200-200-20 MG/5ML PO SUSP: 30.0000 mL | ORAL | Status: DC | PRN

## 2015-06-16 MED ORDER — ASPIRIN EC 325 MG PO TBEC
325.0000 mg | DELAYED_RELEASE_TABLET | Freq: Every day | ORAL | Status: DC
  Administered 2015-06-17 – 2015-06-18 (×2): 325 mg via ORAL
  Filled 2015-06-16 (×2): qty 1

## 2015-06-16 MED ORDER — MIDAZOLAM HCL 2 MG/2ML IJ SOLN
INTRAMUSCULAR | Status: AC
  Filled 2015-06-16: qty 2

## 2015-06-16 MED ORDER — ONDANSETRON HCL 4 MG/2ML IJ SOLN: 4.0000 mg | Freq: Four times a day (QID) | INTRAMUSCULAR | Status: DC | PRN

## 2015-06-16 MED ORDER — ZOLPIDEM TARTRATE 5 MG PO TABS: 10.0000 mg | ORAL_TABLET | Freq: Every evening | ORAL | Status: DC | PRN

## 2015-06-16 MED ORDER — NITROGLYCERIN 0.4 MG SL SUBL: 0.4000 mg | SUBLINGUAL_TABLET | SUBLINGUAL | Status: DC | PRN

## 2015-06-16 MED ORDER — SENNOSIDES-DOCUSATE SODIUM 8.6-50 MG PO TABS: 1.0000 | ORAL_TABLET | Freq: Every evening | ORAL | Status: DC | PRN

## 2015-06-16 MED ORDER — METOCLOPRAMIDE HCL 5 MG/ML IJ SOLN: 5.0000 mg | Freq: Three times a day (TID) | INTRAMUSCULAR | Status: DC | PRN

## 2015-06-16 MED ORDER — ACETAMINOPHEN 650 MG RE SUPP: 650.0000 mg | Freq: Four times a day (QID) | RECTAL | Status: DC | PRN

## 2015-06-16 MED ORDER — FUROSEMIDE 20 MG PO TABS: 20.0000 mg | ORAL_TABLET | Freq: Every day | ORAL | Status: DC | PRN

## 2015-06-16 MED ORDER — ASPIRIN EC 325 MG PO TBEC
325.0000 mg | DELAYED_RELEASE_TABLET | Freq: Two times a day (BID) | ORAL | Status: DC
Start: 2015-06-16 — End: 2015-12-03

## 2015-06-16 MED ORDER — ONDANSETRON HCL 4 MG/2ML IJ SOLN: 4.0000 mg | Freq: Once | INTRAMUSCULAR | Status: DC | PRN

## 2015-06-16 MED ORDER — OXYCODONE HCL 5 MG PO TABS
ORAL_TABLET | ORAL | Status: AC
  Administered 2015-06-16: 10 mg via ORAL
  Filled 2015-06-16: qty 2

## 2015-06-16 MED ORDER — HYDROMORPHONE HCL 1 MG/ML IJ SOLN
0.2500 mg | INTRAMUSCULAR | Status: DC | PRN
  Administered 2015-06-16: 0.5 mg via INTRAVENOUS
  Administered 2015-06-16: 0.25 mg via INTRAVENOUS
  Administered 2015-06-16: 0.5 mg via INTRAVENOUS
  Administered 2015-06-16: 0.25 mg via INTRAVENOUS
  Administered 2015-06-16: 0.5 mg via INTRAVENOUS

## 2015-06-16 MED ORDER — FENTANYL CITRATE (PF) 250 MCG/5ML IJ SOLN
INTRAMUSCULAR | Status: AC
  Filled 2015-06-16: qty 5

## 2015-06-16 MED ORDER — BISACODYL 5 MG PO TBEC: 5.0000 mg | DELAYED_RELEASE_TABLET | Freq: Every day | ORAL | Status: DC | PRN

## 2015-06-16 MED ORDER — DOCUSATE SODIUM 100 MG PO CAPS
100.0000 mg | ORAL_CAPSULE | Freq: Two times a day (BID) | ORAL | Status: DC
  Administered 2015-06-16 – 2015-06-18 (×4): 100 mg via ORAL
  Filled 2015-06-16 (×4): qty 1

## 2015-06-16 MED ORDER — PROPOFOL 500 MG/50ML IV EMUL
INTRAVENOUS | Status: DC | PRN
  Administered 2015-06-16: 75 ug/kg/min via INTRAVENOUS
  Administered 2015-06-16 (×2): 50 ug/kg/min via INTRAVENOUS

## 2015-06-16 MED ORDER — TRANEXAMIC ACID 1000 MG/10ML IV SOLN
2000.0000 mg | Freq: Once | INTRAVENOUS | Status: DC
  Filled 2015-06-16: qty 20

## 2015-06-16 MED ORDER — DIPHENHYDRAMINE HCL 12.5 MG/5ML PO ELIX: 12.5000 mg | ORAL_SOLUTION | ORAL | Status: DC | PRN

## 2015-06-16 MED ORDER — KCL IN DEXTROSE-NACL 20-5-0.45 MEQ/L-%-% IV SOLN
INTRAVENOUS | Status: DC
  Administered 2015-06-17: 01:00:00 via INTRAVENOUS
  Filled 2015-06-16: qty 1000

## 2015-06-16 MED ORDER — DEXAMETHASONE SODIUM PHOSPHATE 10 MG/ML IJ SOLN
10.0000 mg | Freq: Once | INTRAMUSCULAR | Status: AC
  Administered 2015-06-17: 10 mg via INTRAVENOUS
  Filled 2015-06-16: qty 1

## 2015-06-16 MED ORDER — HYDROMORPHONE HCL 1 MG/ML IJ SOLN
INTRAMUSCULAR | Status: AC
  Administered 2015-06-16: 1 mg via INTRAVENOUS
  Filled 2015-06-16: qty 1

## 2015-06-16 MED ORDER — HYDROMORPHONE HCL 1 MG/ML IJ SOLN
INTRAMUSCULAR | Status: AC
  Filled 2015-06-16: qty 1

## 2015-06-16 MED ORDER — TRANEXAMIC ACID 1000 MG/10ML IV SOLN
2000.0000 mg | INTRAVENOUS | Status: DC | PRN
  Administered 2015-06-16: 2000 mg via TOPICAL

## 2015-06-16 MED ORDER — ROCURONIUM BROMIDE 50 MG/5ML IV SOLN
INTRAVENOUS | Status: AC
  Filled 2015-06-16: qty 1

## 2015-06-16 MED ORDER — METOCLOPRAMIDE HCL 5 MG PO TABS
5.0000 mg | ORAL_TABLET | Freq: Three times a day (TID) | ORAL | Status: DC | PRN
  Administered 2015-06-17: 10 mg via ORAL
  Filled 2015-06-16: qty 2

## 2015-06-16 MED ORDER — MIDAZOLAM HCL 5 MG/5ML IJ SOLN
INTRAMUSCULAR | Status: DC | PRN
  Administered 2015-06-16: 2 mg via INTRAVENOUS

## 2015-06-16 MED ORDER — ACETAMINOPHEN 325 MG PO TABS: 650.0000 mg | ORAL_TABLET | Freq: Four times a day (QID) | ORAL | Status: DC | PRN

## 2015-06-16 MED ORDER — ATORVASTATIN CALCIUM 80 MG PO TABS
80.0000 mg | ORAL_TABLET | Freq: Every day | ORAL | Status: DC
  Administered 2015-06-17: 80 mg via ORAL
  Filled 2015-06-16 (×2): qty 1

## 2015-06-16 SURGICAL SUPPLY — 47 items
BLADE SAG 18X100X1.27 (BLADE) IMPLANT
BLADE SURG ROTATE 9660 (MISCELLANEOUS) IMPLANT
CAPT HIP TOTAL 2 ×1 IMPLANT
COVER PERINEAL POST (MISCELLANEOUS) ×2 IMPLANT
COVER SURGICAL LIGHT HANDLE (MISCELLANEOUS) ×2 IMPLANT
DRAPE C-ARM 42X72 X-RAY (DRAPES) ×2 IMPLANT
DRAPE STERI IOBAN 125X83 (DRAPES) ×2 IMPLANT
DRAPE U-SHAPE 47X51 STRL (DRAPES) ×4 IMPLANT
DRSG AQUACEL AG ADV 3.5X10 (GAUZE/BANDAGES/DRESSINGS) ×2 IMPLANT
DURAPREP 26ML APPLICATOR (WOUND CARE) ×2 IMPLANT
ELECT BLADE 4.0 EZ CLEAN MEGAD (MISCELLANEOUS) ×2
ELECT REM PT RETURN 9FT ADLT (ELECTROSURGICAL) ×2
ELECTRODE BLDE 4.0 EZ CLN MEGD (MISCELLANEOUS) ×1 IMPLANT
ELECTRODE REM PT RTRN 9FT ADLT (ELECTROSURGICAL) ×1 IMPLANT
FACESHIELD WRAPAROUND (MASK) ×4 IMPLANT
FACESHIELD WRAPAROUND OR TEAM (MASK) ×2 IMPLANT
GLOVE BIO SURGEON STRL SZ7.5 (GLOVE) ×2 IMPLANT
GLOVE BIO SURGEON STRL SZ8.5 (GLOVE) ×4 IMPLANT
GLOVE BIOGEL PI IND STRL 8 (GLOVE) ×2 IMPLANT
GLOVE BIOGEL PI IND STRL 9 (GLOVE) ×1 IMPLANT
GLOVE BIOGEL PI INDICATOR 8 (GLOVE) ×2
GLOVE BIOGEL PI INDICATOR 9 (GLOVE) ×1
GOWN STRL REUS W/ TWL LRG LVL3 (GOWN DISPOSABLE) ×1 IMPLANT
GOWN STRL REUS W/ TWL XL LVL3 (GOWN DISPOSABLE) ×2 IMPLANT
GOWN STRL REUS W/TWL LRG LVL3 (GOWN DISPOSABLE) ×2
GOWN STRL REUS W/TWL XL LVL3 (GOWN DISPOSABLE) ×4
KIT BASIN OR (CUSTOM PROCEDURE TRAY) ×2 IMPLANT
KIT ROOM TURNOVER OR (KITS) ×2 IMPLANT
MANIFOLD NEPTUNE II (INSTRUMENTS) ×2 IMPLANT
NS IRRIG 1000ML POUR BTL (IV SOLUTION) ×2 IMPLANT
PACK TOTAL JOINT (CUSTOM PROCEDURE TRAY) ×2 IMPLANT
PACK UNIVERSAL I (CUSTOM PROCEDURE TRAY) ×2 IMPLANT
PAD ARMBOARD 7.5X6 YLW CONV (MISCELLANEOUS) ×4 IMPLANT
SAW OSC TIP CART 19.5X105X1.3 (SAW) ×1 IMPLANT
SUT ETHIBOND NAB CT1 #1 30IN (SUTURE) ×4 IMPLANT
SUT VIC AB 0 CT1 27 (SUTURE) ×2
SUT VIC AB 0 CT1 27XBRD ANBCTR (SUTURE) ×1 IMPLANT
SUT VIC AB 1 CT1 27 (SUTURE) ×2
SUT VIC AB 1 CT1 27XBRD ANBCTR (SUTURE) ×1 IMPLANT
SUT VIC AB 2-0 CT1 27 (SUTURE) ×2
SUT VIC AB 2-0 CT1 TAPERPNT 27 (SUTURE) ×1 IMPLANT
SUT VIC AB 3-0 PS2 18 (SUTURE) ×2
SUT VIC AB 3-0 PS2 18XBRD (SUTURE) ×1 IMPLANT
TOWEL OR 17X24 6PK STRL BLUE (TOWEL DISPOSABLE) ×2 IMPLANT
TOWEL OR 17X26 10 PK STRL BLUE (TOWEL DISPOSABLE) ×2 IMPLANT
TRAY FOLEY CATH 14FR (SET/KITS/TRAYS/PACK) IMPLANT
WATER STERILE IRR 1000ML POUR (IV SOLUTION) ×4 IMPLANT

## 2015-06-16 NOTE — Transfer of Care (Signed)
Immediate Anesthesia Transfer of Care Note  [name]  Procedure(s) Performed: Procedure(s): RIGHT TOTAL HIP ARTHROPLASTY ANTERIOR APPROACH (Right)  Patient Location: PACU  Anesthesia Type:Spinal  Level of Consciousness: awake, alert  and oriented  Airway & Oxygen Therapy: Patient Spontanous Breathing  Post-op Assessment: Report given to RN and Patient moving all extremities X 4  Post vital signs: Reviewed and stable  Last Vitals:  Filed Vitals:   06/16/15 1116  BP: 130/77  Pulse: 56  Temp: [id number] C    Complications: No apparent anesthesia complications

## 2015-06-16 NOTE — Op Note (Signed)
OPERATIVE REPORT    DATE OF PROCEDURE:  06/16/2015       PREOPERATIVE DIAGNOSIS:  RIGHT HIP OSTEOARTHRITIS                                                           POSTOPERATIVE DIAGNOSIS:  RIGHT HIP OSTEOARTHRITIS                                                            PROCEDURE: Anterior R total hip arthroplasty using a 54 mm DePuy Pinnacle  Cup, [employer], 0-degree polyethylene liner, a +1.5 mm ceramic head, a 4 hi offset Depuy Triloc stem   SURGEON: [id number] Sampson    ASSISTANT:   Eric K. Barton Dubois  (present throughout entire procedure and necessary for timely completion of the procedure)   ANESTHESIA: Spinal BLOOD LOSS: 400 FLUID REPLACEMENT: 1600 crystalloid Antibiotic: 1gm vancomycin Tranexamic Acid: 2gm topical COMPLICATIONS: none    INDICATIONS FOR PROCEDURE: A 60 y.o. year-old With  RIGHT HIP OSTEOARTHRITIS    for 2 years, x-rays show bone-on-bone arthritic changes, and osteophytes. Despite conservative measures with observation, anti-inflammatory medicine, narcotics, use of a cane, has severe unremitting pain and can ambulate only a few blocks before resting. Patient desires elective R total hip arthroplasty to decrease pain and increase function. The risks, benefits, and alternatives were discussed at length including but not limited to the risks of infection, bleeding, nerve injury, stiffness, blood clots, the need for revision surgery, cardiopulmonary complications, among others, and they were willing to proceed. Questions answered     PROCEDURE IN DETAIL: The patient was identified by armband,  received preoperative IV antibiotics in the holding area at [facility], taken to the operating room , appropriate anesthetic monitors  were attached and  anesthesia was induced with the patienton the gurney. The HANA boots were applied to the feet and he was then transferred to the HANA table with a peroneal post and support underneath the  non-operative le, which was locked in 5 lb traction. Theoperative lower extremity was then prepped and draped in the usual sterile fashion from just above the iliac crest to the knee. And a timeout procedure was performed. We then made a 12 cm incision along the interval at the leading edge of the tensor fascia lata of starting at 2 cm lateral to and 2 cm distal to the ASIS. Small bleeders in the skin and subcutaneous tissue identified and cauterized we dissected down to the fascia and made an incision in the fascia allowing [place] to elevate the fascia of the tensor muscle and exploited the interval between the rectus and the tensor fascia lata. A Hohmann retractor was then placed along the superior neck of the femur and a Cobra retractor along the inferior neck of the femur we teed the capsule starting out at the superior anterior aspect of the acetabulum going distally and made the T along the neck both leaflets of the T were tagged with #2 Ethibond suture. Cobra retractors were then placed along the inferior and superior neck allowing [place] to perform a standard neck cut  and removed the femoral head with a power corkscrew. We then placed a right angle Hohmann retractor along the anterior aspect of the acetabulum a spiked Cobra in the cotyloid notch and posteriorly a Muelller retractor. We then sequentially reamed up to a 53 mm basket reamer obtaining good coverage in all quadrants, verified by C-arm imaging. Under C-arm control with and hammered into place a 54 mm Pinnacle cup in 45 of abduction and 15 of anteversion. The cup seated nicely and required no supplemental screws. We then placed a central hole Eliminator and a 0 polyethylene liner. The foot was then externally rotated to 100, the HANA elevator was placed around the flare of the greater trochanter and the limb was extended and abducted delivering the proximal femur up into the wound. A medium Hohmann retractor was placed over the greater trochanter and a  Mueller retractor along the posterior femoral neck completing the exposure. We then performed releases superiorly and and inferiorly of the capsule going back to the pirformis fossa superiorly and to the lesser trochanter inferiorly. We then entered the proximal femur with the box cutting offset chisel followed by, a canal sounder, the chili pepper and broaching up to a 4 broach. This seated nicely and we reamed the calcar. A trial reduction was performed with a 1.5 mm 36 mm head.The limb lengths were excellent the hip was stable in 90 of external rotation. At this point the trial components removed and we hammered into place a # 4 Tri-Lock stem with [employer] coating. This was a Hi offset stem and a + 1.5 mm ceramic ball was then hammered into place the hip was reduced and final C-arm images obtained. The wound was thoroughly irrigated with normal saline solution. We repaired the ant capsule and the tensor fascia lot a with running 0 vicryl suture. the subcutaneous tissue was closed with 2-0 and 3-0 Vicryl suture followed by an Aquacil dressing. At this point the patient was awaken and transferred to hospital gurney without difficulty. The subcutaneous tissue with 0 and 2-0 undyed Vicryl suture and the skin with running  3-0 vicryl subcuticular suture. Aquacil dressing was applied. The patient was then unclamped, rolled supine, awaken extubated and taken to recovery room without difficulty in stable condition.   Nathan Sampson 06/16/2015, 3:25 PM

## 2015-06-16 NOTE — Anesthesia Postprocedure Evaluation (Signed)
Anesthesia Post Note  [name]  Procedure(s) Performed: Procedure(s) (LRB): RIGHT TOTAL HIP ARTHROPLASTY ANTERIOR APPROACH (Right)  Patient location during evaluation: PACU Anesthesia Type: Spinal Level of consciousness: oriented and awake and alert Pain management: pain level controlled Vital Signs Assessment: post-procedure vital signs reviewed and stable Respiratory status: spontaneous breathing, respiratory function stable and patient connected to nasal cannula oxygen Cardiovascular status: blood pressure returned to baseline and stable Postop Assessment: no headache, no backache and spinal receding Anesthetic complications: no    Last Vitals:  Filed Vitals:   06/16/15 1730 06/16/15 1800  BP: 121/96 115/69  Pulse: 69 73  Temp: 36.3 C   Resp: 21 17    Last Pain:  Filed Vitals:   06/16/15 1905  PainSc: Hopkins Justyn Langham

## 2015-06-16 NOTE — Anesthesia Preprocedure Evaluation (Signed)
Anesthesia Evaluation  Patient identified by MRN, date of birth, ID band Patient awake    Reviewed: Allergy & Precautions, H&P , NPO status , Patient's Chart, lab work & pertinent test results, reviewed documented beta blocker date and time   Airway Mallampati: II  TM Distance: >3 FB Neck ROM: full    Dental no notable dental hx. (+) Teeth Intact, Dental Advisory Given   Pulmonary Current Smoker,  Sleep apnea surgery   Pulmonary exam normal breath sounds clear to auscultation       Cardiovascular Exercise Tolerance: Good hypertension, Pt. on home beta blockers + CAD and + Cardiac Stents  Normal cardiovascular exam Rhythm:regular Rate:Normal  Stents mid LAD and distal RCA - drug eluting 7/09.  No CP or problems.  Normal nuclear stress 9/09.  Pt has cardiac clearance for surgery from Dr. Martinique (see Epic telephone encounter dated 06/05/15).    Neuro/Psych negative neurological ROS  negative psych ROS   GI/Hepatic negative GI ROS, Neg liver ROS,   Endo/Other  negative endocrine ROS  Renal/GU negative Renal ROS  negative genitourinary   Musculoskeletal  (+) Arthritis ,   Abdominal   Peds  Hematology negative hematology ROS (+)   Anesthesia Other Findings   Reproductive/Obstetrics negative OB ROS                             Anesthesia Physical  Anesthesia Plan  ASA: III  Anesthesia Plan: Spinal   Post-op Pain Management:    Induction: Intravenous  Airway Management Planned: Simple Face Mask  Additional Equipment:   Intra-op Plan:   Post-operative Plan: Extubation in OR  Informed Consent: I have reviewed the patients History and Physical, chart, labs and discussed the procedure including the risks, benefits and alternatives for the proposed anesthesia with the patient or authorized representative who has indicated his/her understanding and acceptance.   Dental Advisory  Given  Plan Discussed with: CRNA, Surgeon and Anesthesiologist  Anesthesia Plan Comments:         Anesthesia Quick Evaluation

## 2015-06-16 NOTE — Anesthesia Procedure Notes (Addendum)
Procedure Name: MAC Date/Time: 06/16/2015 12:59 PM Performed by: [name] Pre-anesthesia Checklist: Patient identified, Emergency Drugs available, Suction available, Patient being monitored and Timeout performed Patient Re-evaluated:Patient Re-evaluated prior to inductionOxygen Delivery Method: Simple face mask   Spinal Patient location during procedure: OR Staffing Anesthesiologist: Lincon Sahlin Performed by: anesthesiologist  Preanesthetic Checklist Completed: patient identified, site marked, surgical consent, pre-op evaluation, timeout performed, IV checked, risks and benefits discussed and monitors and equipment checked Spinal Block Patient position: sitting Prep: ChloraPrep Patient monitoring: heart rate, continuous pulse ox and blood pressure Location: L4-5 Injection technique: single-shot Needle Needle type: Pencan  Needle gauge: 24 G Needle length: 9 cm Additional Notes Functioning IV was confirmed and monitors were applied. Sterile prep and drape, including hand hygiene, mask and sterile gloves were used. The patient was positioned and the spine was prepped. The skin was anesthetized with lidocaine.  Free flow of clear CSF was obtained prior to injecting local anesthetic into the CSF.  The spinal needle aspirated freely following injection.  The needle was carefully withdrawn.  The patient tolerated the procedure well. Consent was obtained prior to procedure with all questions answered and concerns addressed.  [place] Pink, MD

## 2015-06-16 NOTE — Discharge Instructions (Signed)

## 2015-06-16 NOTE — Interval H&P Note (Signed)
History and Physical Interval Note:  06/16/2015 12:43 PM  Nathan Sampson  has presented today for surgery, with the diagnosis of RIGHT HIP OSTEOARTHRITIS   The various methods of treatment have been discussed with the patient and family. After consideration of risks, benefits and other options for treatment, the patient has consented to  Procedure(s): RIGHT TOTAL HIP ARTHROPLASTY ANTERIOR APPROACH (Right) as a surgical intervention .  The patient's history has been reviewed, patient examined, no change in status, stable for surgery.  I have reviewed the patient's chart and labs.  Questions were answered to the patient's satisfaction.     Kerin Salen

## 2015-06-17 ENCOUNTER — Encounter (HOSPITAL_COMMUNITY): Payer: Self-pay | Admitting: Orthopedic Surgery

## 2015-06-17 LAB — CBC
HCT: 32.4 % — ABNORMAL LOW (ref 39.0–52.0)
Hemoglobin: 11 g/dL — ABNORMAL LOW (ref 13.0–17.0)
MCH: 29.7 pg (ref 26.0–34.0)
MCHC: 34 g/dL (ref 30.0–36.0)
MCV: 87.6 fL (ref 78.0–100.0)
PLATELETS: 189 10*3/uL (ref 150–400)
RBC: 3.7 MIL/uL — ABNORMAL LOW (ref 4.22–5.81)
RDW: 12.6 % (ref 11.5–15.5)
WBC: 12.6 10*3/uL — ABNORMAL HIGH (ref 4.0–10.5)

## 2015-06-17 NOTE — Care Management Note (Signed)
Case Management Note  Patient Details  Name: Nathan Sampson MRN: [id number] Date of Birth: [birth year]  Subjective/Objective:  60 yr old male s/p right total hip arthroplasty, anterior approach.         Action/Plan: Case manager spoke with patient concerning home health and DME needs at discharge. Choice was offered. Patient was preoperatively setup with [place], no changes. He states he has rolling walker and 3in1. Patient's family will assist at discharge.   Expected Discharge Date:   06/18/15               Expected Discharge Plan:  [place]  In-House Referral:  NA  Discharge planning Services  CM Consult  Post Acute Care Choice:  Home Health Choice offered to:  Patient  DME Arranged:  N/A DME Agency:  NA  HH Arranged:  PT [place] Agency:  [facility]  Status of Service:  Completed, signed off  Medicare Important Message Given:    Date Medicare IM Given:    Medicare IM give by:    Date Additional Medicare IM Given:    Additional Medicare Important Message give by:     If discussed at [place] of Stay Meetings, dates discussed:    Additional Comments:  Ninfa Meeker, RN 06/17/2015, 11:38 AM

## 2015-06-17 NOTE — Evaluation (Signed)
Physical Therapy Evaluation Patient Details Name: Nathan Sampson MRN: [id number] DOB: 19-Oct-1955 Today's Date: 06/17/2015   History of Present Illness  pt admitted for R THA. PMHx: arthritis, CAD, HTN, DDD  Clinical Impression  Pt pleasant and standing in his room on his own on arrival. Pt with education for safety, RW use, transfers, gait and HEP. Pt with decreased strength, activity tolerance, gait and function who will benefit from acute therapy to maximize function strength and ambulation to decrease burden of care.     Follow Up Recommendations Home health PT    Equipment Recommendations  3in1 (PT);Rolling walker with 5" wheels    Recommendations for Other Services OT consult     Precautions / Restrictions Restrictions Weight Bearing Restrictions: Yes RLE Weight Bearing: Weight bearing as tolerated      Mobility  Bed Mobility               General bed mobility comments: standing EOB on arrival  Transfers Overall transfer level: Needs assistance   Transfers: Sit to/from Stand Sit to Stand: Supervision         General transfer comment: cues for hand and RLE positioning with transfers x 2  Ambulation/Gait Ambulation/Gait assistance: Supervision Ambulation Distance (Feet): 170 Feet Assistive device: Rolling walker (2 wheeled) Gait Pattern/deviations: Step-through pattern;Decreased stride length;Trunk flexed   Gait velocity interpretation: Below normal speed for age/gender General Gait Details: cues for posture, position in RW and increased hip flexion on RLE to swing  Stairs Stairs: Yes Stairs assistance: Min assist Stair Management: Backwards;With walker Number of Stairs: 4 General stair comments: cues for sequence, assist for RW stability and handout provided  Wheelchair Mobility    Modified Rankin (Stroke Patients Only)       Balance                                             Pertinent Vitals/Pain Pain Assessment:  0-10 Pain Score: 8  Pain Location: right hip Pain Descriptors / Indicators: Aching Pain Intervention(s): Limited activity within patient's tolerance;Monitored during session;Repositioned;Ice applied    Home Living Family/patient expects to be discharged to:: Private residence Living Arrangements: Spouse/significant other Available Help at Discharge: Family;Available 24 hours/day Type of Home: House Home Access: Stairs to enter Entrance Stairs-Rails: Right Entrance Stairs-Number of Steps: 4 Home Layout: One level Home Equipment: Shower seat - built in      Prior Function Level of Independence: Independent               Hand Dominance        Extremity/Trunk Assessment   Upper Extremity Assessment: Overall WFL for tasks assessed           Lower Extremity Assessment: RLE deficits/detail RLE Deficits / Details: decreased strength and ROM post op    Cervical / Trunk Assessment: Normal  Communication   Communication: No difficulties  Cognition Arousal/Alertness: Awake/alert Behavior During Therapy: WFL for tasks assessed/performed Overall Cognitive Status: Within Functional Limits for tasks assessed                      General Comments      Exercises Total Joint Exercises Hip ABduction/ADduction: AAROM;Right;10 reps;Seated Long Arc Quad: AROM;Right;10 reps;Seated Marching in Standing: AAROM;Right;10 reps;Seated      Assessment/Plan    PT Assessment Patient needs continued PT services  PT Diagnosis Difficulty walking;Acute  pain   PT Problem List Decreased strength;Decreased activity tolerance;Decreased range of motion;Pain;Decreased knowledge of use of DME;Decreased mobility  PT Treatment Interventions DME instruction;Gait training;Functional mobility training;Therapeutic activities;Therapeutic exercise;Patient/family education;Stair training   PT Goals (Current goals can be found in the Care Plan section) Acute Rehab PT Goals Patient Stated  Goal: return to work, fishing and cooking PT Goal Formulation: With patient Time For Goal Achievement: 06/24/15 Potential to Achieve Goals: Good    Frequency 7X/week   Barriers to discharge        Co-evaluation               End of Session   Activity Tolerance: Patient tolerated treatment well Patient left: in chair;with call bell/phone within reach Nurse Communication: Mobility status         Time: 0720-0745 PT Time Calculation (min) (ACUTE ONLY): 25 min   Charges:   PT Evaluation $PT Eval Moderate Complexity: 1 Procedure PT Treatments $Gait Training: 8-22 mins   PT G CodesMelford Aase 06/17/2015, 7:53 AM Elwyn Reach, [place]

## 2015-06-17 NOTE — Progress Notes (Signed)
Physical Therapy Treatment Patient Details Name: Nathan Sampson MRN: [id number] DOB: 1955/12/31 Today's Date: June 25, 2015    History of Present Illness pt admitted for R THA. PMHx: arthritis, CAD, HTN, DDD    PT Comments    Pt with continued difficulty with hip and knee flexion on RLE. Pt with cues and assist for HEP, stairs and gait. Pt encouraged to continue mobility between sessions. Will continue to follow.   Follow Up Recommendations  Home health PT     Equipment Recommendations       Recommendations for Other Services       Precautions / Restrictions Restrictions RLE Weight Bearing: Weight bearing as tolerated    Mobility  Bed Mobility               General bed mobility comments: in chair on arrival, denied bed mobility  Transfers Overall transfer level: Needs assistance   Transfers: Sit to/from Stand Sit to Stand: Supervision         General transfer comment: cues for hand and RLE positioning  Ambulation/Gait Ambulation/Gait assistance: Supervision Ambulation Distance (Feet): 175 Feet Assistive device: Rolling walker (2 wheeled) Gait Pattern/deviations: Step-to pattern;Decreased stride length;Trunk flexed   Gait velocity interpretation: Below normal speed for age/gender General Gait Details: cues for posture, position in RW and increased hip flexion on RLE to swing   Stairs Stairs: Yes Stairs assistance: Min assist Stair Management: Backwards;With walker Number of Stairs: 4 General stair comments: cues for sequence, assist for RW stability   Wheelchair Mobility    Modified Rankin (Stroke Patients Only)       Balance                                    Cognition Arousal/Alertness: Awake/alert Behavior During Therapy: WFL for tasks assessed/performed Overall Cognitive Status: Within Functional Limits for tasks assessed                      Exercises Total Joint Exercises Gluteal Sets: AROM;Seated;Both;15  reps Hip ABduction/ADduction: Right;Seated;AROM;15 reps Long Arc Quad: AROM;Right;Seated;15 reps Marching in Standing: AAROM;Right;Seated;15 reps    General Comments        Pertinent Vitals/Pain Pain Score: 9  Pain Location: right hip Pain Descriptors / Indicators: Aching;Throbbing Pain Intervention(s): Limited activity within patient's tolerance;Monitored during session;Premedicated before session;Repositioned    Home Living                      Prior Function            PT Goals (current goals can now be found in the care plan section) Progress towards PT goals: Progressing toward goals    Frequency       PT Plan Current plan remains appropriate    Co-evaluation             End of Session   Activity Tolerance: Patient tolerated treatment well Patient left: in chair;with call bell/phone within reach     Time: [id number] PT Time Calculation (min) (ACUTE ONLY): 22 min  Charges:  $Gait Training: 8-22 mins                    G Codes:      Melford Aase 2015/06/25, 12:18 PM Elwyn Reach, [place]

## 2015-06-17 NOTE — Progress Notes (Signed)
Patient ID: Nathan Sampson, male   DOB: [birth year], 60 y.o.   MRN: [id number] PATIENT ID: Nathan Sampson  MRN: [id number]  DOB/AGE:  26-Jun-1955 / 60 y.o.  1 Day Post-Op Procedure(s) (LRB): RIGHT TOTAL HIP ARTHROPLASTY ANTERIOR APPROACH (Right)    PROGRESS NOTE Subjective: Patient is alert, oriented, no Nausea, no Vomiting, yes passing gas, . Taking PO well. Denies SOB, Chest or Calf Pain. Using Incentive Spirometer, PAS in place. Ambulate in room WBAT Patient reports pain as  3/10  .    Objective: Vital signs in last 24 hours: Filed Vitals:   06/16/15 1900 06/16/15 2009 06/17/15 0042 06/17/15 0559  BP: 123/82 119/80 111/63 126/73  Pulse: 94 95 114 104  Temp:  98.3 F (36.8 C) 98.7 F (37.1 C) 98.4 F (36.9 C)  TempSrc:  Oral Oral Oral  Resp: 17 16 16 18   Height:      Weight:      SpO2: 98% 98% 96% 99%      Intake/Output from previous day: I/O last 3 completed shifts: In: 1537.5 [I.V.:1537.5] Out: 1600 [Urine:1200; Blood:400]   Intake/Output this shift:     LABORATORY DATA:  Recent Labs  06/17/15 0525  WBC 12.6*  HGB 11.0*  HCT 32.4*  PLT 189    Examination: Neurologically intact ABD soft Neurovascular intact Sensation intact distally Intact pulses distally Dorsiflexion/Plantar flexion intact Incision: dressing C/D/I No cellulitis present Compartment soft} XR AP&Lat of hip shows well placed\fixed THA  Assessment:   1 Day Post-Op Procedure(s) (LRB): RIGHT TOTAL HIP ARTHROPLASTY ANTERIOR APPROACH (Right) ADDITIONAL DIAGNOSIS:  Expected Acute Blood Loss Anemia, Hypertension  Plan: PT/OT WBAT, THA  posterior precautions  DVT Prophylaxis: SCDx72 hrs, ASA 325 mg BID x 2 weeks  DISCHARGE PLAN: Home when passes PT  DISCHARGE NEEDS: HHPT, Walker and 3-in-1 comode seat

## 2015-06-17 NOTE — Care Management (Signed)
Utilization review completed. Wilgus Deyton, RN Case Manager [phone]. 

## 2015-06-18 ENCOUNTER — Encounter (HOSPITAL_COMMUNITY): Payer: Self-pay | Admitting: Orthopedic Surgery

## 2015-06-18 LAB — CBC
HCT: 28.6 % — ABNORMAL LOW (ref 39.0–52.0)
HEMOGLOBIN: 9.6 g/dL — AB (ref 13.0–17.0)
MCH: 29.6 pg (ref 26.0–34.0)
MCHC: 33.6 g/dL (ref 30.0–36.0)
MCV: 88.3 fL (ref 78.0–100.0)
PLATELETS: 151 10*3/uL (ref 150–400)
RBC: 3.24 MIL/uL — AB (ref 4.22–5.81)
RDW: 12.7 % (ref 11.5–15.5)
WBC: 12 10*3/uL — AB (ref 4.0–10.5)

## 2015-06-18 NOTE — Progress Notes (Addendum)
Pt c/o fingers last 2 fingers on rt hand numb tingling Joanell Rising PA notified stated to go ahead and discharge him.No other sx noted.Anesthesia came to check pt stated he was ok to go

## 2015-06-18 NOTE — Progress Notes (Signed)
PATIENT ID: Nathan Sampson  MRN: [id number]  DOB/AGE:  June 30, 1955 / 60 y.o.  2 Days Post-Op Procedure(s) (LRB): RIGHT TOTAL HIP ARTHROPLASTY ANTERIOR APPROACH (Right)    PROGRESS NOTE Subjective: Patient is alert, oriented, no Nausea, no Vomiting, yes passing gas, . Taking PO with sips of liquids. Denies SOB, Chest or Calf Pain. Using Incentive Spirometer, PAS in place. Ambulate WBAT with pt walking 175 ft with therapy and practicing steps. Patient reports pain as  4/10  .    Objective: Vital signs in last 24 hours: Filed Vitals:   06/17/15 1554 06/17/15 2307 06/18/15 0606 06/18/15 1111  BP: 102/60 103/47 101/55 120/62  Pulse: 87 88 88   Temp:  99.3 F (37.4 C) 99.6 F (37.6 C)   TempSrc:  Oral Oral   Resp:  16 16   Height:      Weight:      SpO2:  95% 96%       Intake/Output from previous day: I/O last 3 completed shifts: In: 1257.5 [P.O.:720; I.V.:537.5] Out: 2800 [Urine:2800]   Intake/Output this shift: Total I/O In: 240 [P.O.:240] Out: 300 [Urine:300]   LABORATORY DATA:  Recent Labs  06/17/15 0525 06/18/15 0532  WBC 12.6* 12.0*  HGB 11.0* 9.6*  HCT 32.4* 28.6*  PLT 189 151    Examination: Neurologically intact Neurovascular intact Sensation intact distally Intact pulses distally Dorsiflexion/Plantar flexion intact Incision: dressing C/D/I No cellulitis present Compartment soft} XR AP&Lat of hip shows well placed\fixed THA  Assessment:   2 Days Post-Op Procedure(s) (LRB): RIGHT TOTAL HIP ARTHROPLASTY ANTERIOR APPROACH (Right) ADDITIONAL DIAGNOSIS:  Expected Acute Blood Loss Anemia, Hypertension  Plan: PT/OT WBAT,   DVT Prophylaxis: SCDx72 hrs, ASA 325 mg BID x 2 weeks  DISCHARGE PLAN: Home  DISCHARGE NEEDS: HHPT, Walker and 3-in-1 comode seat

## 2015-06-18 NOTE — Progress Notes (Signed)
Physical Therapy Treatment Patient Details Name: Nathan Sampson MRN: [id number] DOB: May 04, 1956 Today's Date: 06/18/2015    History of Present Illness pt admitted for R THA. PMHx: arthritis, CAD, HTN, DDD    PT Comments    Patient progressing well with PT. Practice stairs next session. Current plan remains appropriate.      Follow Up Recommendations  Home health PT     Equipment Recommendations  3in1 (PT);Rolling walker with 5" wheels    Recommendations for Other Services OT consult     Precautions / Restrictions Precautions Precautions: Fall Restrictions Weight Bearing Restrictions: Yes RLE Weight Bearing: Weight bearing as tolerated    Mobility  Bed Mobility Overal bed mobility: Needs Assistance Bed Mobility: Supine to Sit     Supine to sit: Supervision     General bed mobility comments: vc for technique and hand placement with increased time needed; supervision for safety with HOB flat and no use of bedrails  Transfers Overall transfer level: Needs assistance Equipment used: Rolling walker (2 wheeled) Transfers: Sit to/from Stand Sit to Stand: Supervision         General transfer comment: carry over of safe hand placement and technique; supervision for safety  Ambulation/Gait Ambulation/Gait assistance: Supervision Ambulation Distance (Feet): 200 Feet Assistive device: Rolling walker (2 wheeled) Gait Pattern/deviations: Step-through pattern;Decreased step length - left;Decreased stance time - right;Trunk flexed   Gait velocity interpretation: Below normal speed for age/gender General Gait Details: vc for maintaining upright posture, sequencing, and position of RW; encouragement to increase WB on R LE; pt with improved bilat step length after initial 50 ft   Stairs            Wheelchair Mobility    Modified Rankin (Stroke Patients Only)       Balance Overall balance assessment: Needs assistance Sitting-balance support: Feet  supported Sitting balance-Leahy Scale: Good     Standing balance support: No upper extremity supported Standing balance-Leahy Scale: Fair Standing balance comment: Pt able to stand at sink and complete grooming activities without UE support                    Cognition Arousal/Alertness: Awake/alert Behavior During Therapy: WFL for tasks assessed/performed Overall Cognitive Status: Within Functional Limits for tasks assessed                      Exercises Total Joint Exercises Quad Sets: AROM;Right;10 reps;Supine Heel Slides: AROM;10 reps;Right;Supine Hip ABduction/ADduction: AROM;Right;10 reps;Supine    General Comments        Pertinent Vitals/Pain Pain Assessment: Faces Faces Pain Scale: Hurts little more Pain Location: R hip Pain Descriptors / Indicators: Sore Pain Intervention(s): Limited activity within patient's tolerance;Monitored during session;Premedicated before session;Repositioned    Home Living Family/patient expects to be discharged to:: Private residence Living Arrangements: Spouse/significant other Available Help at Discharge: Family;Available 24 hours/day Type of Home: House Home Access: Stairs to enter Entrance Stairs-Rails: Right Home Layout: Two level;Able to live on main level with bedroom/bathroom Home Equipment: Shower seat - built in;Toilet riser      Prior Function Level of Independence: Independent          PT Goals (current goals can now be found in the care plan section) Acute Rehab PT Goals Patient Stated Goal: go home Progress towards PT goals: Progressing toward goals    Frequency  7X/week    PT Plan Current plan remains appropriate    Co-evaluation  End of Session Equipment Utilized During Treatment: Gait belt Activity Tolerance: Patient tolerated treatment well Patient left: in chair;with call bell/phone within reach     Time: 0834-0903 PT Time Calculation (min) (ACUTE ONLY): 29  min  Charges:  $Gait Training: 8-22 mins $Therapeutic Exercise: 8-22 mins                    G Codes:      [place] April, PTA Pager: [phone]   06/18/2015, 12:13 PM

## 2015-06-18 NOTE — Discharge Summary (Signed)
Patient ID: Nathan Sampson MRN: [id number] DOB/AGE: Aug 10, 1955 60 y.o.  Admit date: 06/16/2015 Discharge date: 06/18/2015  Admission Diagnoses:  Principal Problem:   Primary osteoarthritis of right hip   Discharge Diagnoses:  Same  Past Medical History  Diagnosis Date  . Coronary artery disease     Status post stenting of the mid LAD and distal right coronary with drug-eluting stents in July 2009.  He is Asymptomatic. He had a strss echo in September 2009 which was normal.  . Dyslipidemia   . Hypertension     Poorly Controlled. Exacerbated by sedentary lifestyle, diet and weight gain, and stress.  . OCD (obsessive compulsive disorder)   . Arthritis     Surgeries: Procedure(s): RIGHT TOTAL HIP ARTHROPLASTY ANTERIOR APPROACH on 06/16/2015   Consultants:    Discharged Condition: Improved  Hospital Course: Nathan Sampson is an 60 y.o. male who was admitted 06/16/2015 for operative treatment ofPrimary osteoarthritis of right hip. Patient has severe unremitting pain that affects sleep, daily activities, and work/hobbies. After pre-op clearance the patient was taken to the operating room on 06/16/2015 and underwent  Procedure(s): RIGHT TOTAL HIP ARTHROPLASTY ANTERIOR APPROACH.    Patient was given perioperative antibiotics: Anti-infectives    Start     Dose/Rate Route Frequency Ordered Stop   06/16/15 1200  vancomycin (VANCOCIN) 1,500 mg in sodium chloride 0.9 % 500 mL IVPB     1,500 mg 250 mL/hr over 120 Minutes Intravenous To ShortStay Surgical 06/15/15 1219 06/16/15 1455       Patient was given sequential compression devices, early ambulation, and chemoprophylaxis to prevent DVT.  Patient benefited maximally from hospital stay and there were no complications.    Recent vital signs: Patient Vitals for the past 24 hrs:  BP Temp Temp src Pulse Resp SpO2  06/18/15 1111 120/62 mmHg - - - - -  06/18/15 0606 (!) 101/55 mmHg 99.6 F (37.6 C) Oral 88 16 96 %  06/17/15 2307 (!)  103/47 mmHg 99.3 F (37.4 C) Oral 88 16 95 %  06/17/15 1554 102/60 mmHg - - 87 - -  06/17/15 1500 102/60 mmHg 99.1 F (37.3 C) Oral 87 18 98 %     Recent laboratory studies:  Recent Labs  06/17/15 0525 06/18/15 0532  WBC 12.6* 12.0*  HGB 11.0* 9.6*  HCT 32.4* 28.6*  PLT 189 151     Discharge Medications:     Medication List    STOP taking these medications        HYDROcodone-acetaminophen 7.5-325 MG tablet  Commonly known as:  NORCO     meloxicam 15 MG tablet  Commonly known as:  MOBIC      TAKE these medications        aspirin EC 325 MG tablet  Take 1 tablet (325 mg total) by mouth 2 (two) times daily.     atorvastatin 80 MG tablet  Commonly known as:  LIPITOR  Take 1 tablet (80 mg total) by mouth at bedtime.     carvedilol 12.5 MG tablet  Commonly known as:  COREG  TAKE 1 TABLET BY MOUTH 2 TIMES DAILY WITH A MEAL. INS ALLOWS 30 DAYS     EQL COQ10 300 MG Caps  Generic drug:  Coenzyme Q10  Take 300 mg by mouth daily.     furosemide 20 MG tablet  Commonly known as:  LASIX  Take 1 tablet (20 mg total) by mouth daily as needed for edema.     methocarbamol 500 MG  tablet  Commonly known as:  ROBAXIN  Take 1 tablet (500 mg total) by mouth 2 (two) times daily with a meal.     nitroGLYCERIN 0.4 MG SL tablet  Commonly known as:  NITROSTAT  Place 0.4 mg under the tongue every 5 (five) minutes as needed. Chest pain     oxyCODONE-acetaminophen 5-325 MG tablet  Commonly known as:  ROXICET  Take 1 tablet by mouth every 4 (four) hours as needed.     ramipril 2.5 MG capsule  Commonly known as:  ALTACE  Take 2.5 mg by mouth 2 (two) times daily.     zolpidem 10 MG tablet  Commonly known as:  AMBIEN  Take 1 tablet (10 mg total) by mouth at bedtime as needed.        Diagnostic Studies: Dg Chest 2 View  06/04/2015  CLINICAL DATA:  Preop examination.  Cough. EXAM: CHEST  2 VIEW COMPARISON:  10/09/2011 FINDINGS: Linear scarring at the left base. Right lung is  clear. Heart is normal size. No effusions. No acute bony abnormality. IMPRESSION: Lingular scarring.  No active disease. Electronically Signed   By: Rolm Baptise M.D.   On: 06/04/2015 13:07   Dg Pelvis Portable  06/16/2015  CLINICAL DATA:  Post of right hip total arthroplasty EXAM: PORTABLE PELVIS 1-2 VIEWS COMPARISON:  None. FINDINGS: Two portable views of the pelvis submitted. There is right hip prosthesis with anatomic alignment. Postsurgical changes are noted small amount of periarticular soft tissue air. IMPRESSION: Right hip prosthesis with anatomic alignment. Postsurgical changes are noted. Electronically Signed   By: Lahoma Crocker M.D.   On: 06/16/2015 16:39   Dg Hip Operative Unilat With Pelvis Right  06/16/2015  CLINICAL DATA:  Right total hip arthroplasty. EXAM: OPERATIVE right HIP (WITH PELVIS IF PERFORMED) 2 VIEWS TECHNIQUE: Fluoroscopic spot image(s) were submitted for interpretation post-operatively. COMPARISON:  Radiographs 11/25/2014 FINDINGS: The femoral and acetabular components are well seated. No complicating features are demonstrated. The visualized bony pelvis is intact. IMPRESSION: Well seated components of a total right hip arthroplasty. No complicating features. Electronically Signed   By: Marijo Sanes M.D.   On: 06/16/2015 15:19    Disposition: 01-Home or Self Care      Discharge Instructions    Call MD / Call 911    Complete by:  As directed   If you experience chest pain or shortness of breath, CALL 911 and be transported to the hospital emergency room.  If you develope a fever above 101 F, pus (white drainage) or increased drainage or redness at the wound, or calf pain, call your surgeon's office.     Change dressing    Complete by:  As directed   You may change your dressing on day 5, then change the dressing daily with sterile 4 x 4 inch gauze dressing and paper tape.  You may clean the incision with alcohol prior to redressing     Constipation Prevention    Complete  by:  As directed   Drink plenty of fluids.  Prune juice may be helpful.  You may use a stool softener, such as Colace (over the counter) 100 mg twice a day.  Use MiraLax (over the counter) for constipation as needed.     Diet - low sodium heart healthy    Complete by:  As directed      Driving restrictions    Complete by:  As directed   No driving for 2 weeks     Follow  the hip precautions as taught in Physical Therapy    Complete by:  As directed      Increase activity slowly as tolerated    Complete by:  As directed      Patient may shower    Complete by:  As directed   You may shower without a dressing once there is no drainage.  Do not wash over the wound.  If drainage remains, cover wound with plastic wrap and then shower.           Follow-up Information    Follow up with Kerin Salen, MD In 2 weeks.   Specialty:  Orthopedic Surgery   Contact information:   [place] [address] [phone]       Follow up with [facility].   Why:  Someone from [facility] will contact you concerning start date and time for therapy.   Contact information:   [address] [place] [address] [phone]        Signed: Hardin Negus, Kenneth Lax R 06/18/2015, 11:22 AM

## 2015-06-18 NOTE — Evaluation (Addendum)
Occupational Therapy Evaluation Patient Details Name: Nathan Sampson MRN: [id number] DOB: 12/18/1955 Today's Date: 06/18/2015    History of Present Illness pt admitted for R THA. PMHx: arthritis, CAD, HTN, DDD   Clinical Impression   Pt reports he was independent with ADLs PTA; beginning to have difficulty with socks and shoes on R foot. Pt currently overall supervision for safety with functional mobility and ADLs with use of AE for LB ADLs. All education complete; pt with no further questions or concerns for OT. Pt planning to d/c home with 24/7 supervision from family. Pt ready to d/c from OT standpoint; signing off at this time. Thank you for this referral.     Follow Up Recommendations  No OT follow up;Supervision - Intermittent    Equipment Recommendations  Other (comment) (AE)    Recommendations for Other Services       Precautions / Restrictions Precautions Precautions: Fall Restrictions Weight Bearing Restrictions: Yes RLE Weight Bearing: Weight bearing as tolerated LLE Weight Bearing: Weight bearing as tolerated      Mobility Bed Mobility               General bed mobility comments: Pt OOB in chair  Transfers Overall transfer level: Needs assistance Equipment used: Rolling walker (2 wheeled) Transfers: Sit to/from Stand Sit to Stand: Supervision         General transfer comment: Good hand placement and technique    Balance Overall balance assessment: Needs assistance Sitting-balance support: Feet supported Sitting balance-Leahy Scale: Good     Standing balance support: No upper extremity supported;During functional activity Standing balance-Leahy Scale: Fair Standing balance comment: Pt able to stand at sink and complete grooming activities without UE support                            ADL Overall ADL's : Needs assistance/impaired Eating/Feeding: Set up;Sitting   Grooming: Supervision/safety;Brushing hair;Oral care;Wash/dry  hands;Standing       Lower Body Bathing: Supervison/ safety;With adaptive equipment;Sit to/from stand Lower Body Bathing Details (indicate cue type and reason): Educated on use of long handled sponge     Lower Body Dressing: Supervision/safety;Sit to/from stand;With adaptive equipment Lower Body Dressing Details (indicate cue type and reason): Educated on use of long handled shoe horn, reacher, and sock aide; pt able to return demo use of sock aide and reacher. Pt reports wife is going to purchase AE kit today. Educated on compensatory strategies for LB ADLs. Toilet Transfer: Supervision/safety;Ambulation;Comfort height toilet;RW Armed forces technical officer Details (indicate cue type and reason): Used L hand assist to simulate home environment Toileting- Clothing Manipulation and Hygiene: Supervision/safety;Sit to/from stand   Tub/ Banker: Walk-in shower;Ambulation;Shower seat;Rolling walker;Min Chief Executive Officer Details (indicate cue type and reason): Educated on walk in shower transfer technique; pt able to return demo Functional mobility during ADLs: Supervision/safety;Rolling walker General ADL Comments: No family present for OT eval. Educated on home safety, supervision during ADLs and mobility, ice for edema and pain; pt verbalized understanding.     Vision     Perception     Praxis      Pertinent Vitals/Pain Pain Assessment: Faces Faces Pain Scale: Hurts little more Pain Location: R hip Pain Descriptors / Indicators: Sore Pain Intervention(s): Limited activity within patient's tolerance;Monitored during session;Ice applied     Hand Dominance Right   Extremity/Trunk Assessment Upper Extremity Assessment Upper Extremity Assessment: RUE deficits/detail RUE Deficits / Details: Pt reports numbness in 4th and 5th  digits. Reports that he noticed it this morning. Strength overall WFL.   Lower Extremity Assessment Lower Extremity Assessment: Defer to PT evaluation    Cervical / Trunk Assessment Cervical / Trunk Assessment: Normal   Communication Communication Communication: No difficulties   Cognition Arousal/Alertness: Awake/alert Behavior During Therapy: WFL for tasks assessed/performed Overall Cognitive Status: Within Functional Limits for tasks assessed                     General Comments       Exercises       Shoulder Instructions      Home Living Family/patient expects to be discharged to:: Private residence Living Arrangements: Spouse/significant other Available Help at Discharge: Family;Available 24 hours/day Type of Home: House Home Access: Stairs to enter [employer] of Steps: 4 Entrance Stairs-Rails: Right Home Layout: Two level;Able to live on main level with bedroom/bathroom     Bathroom Shower/Tub: Walk-in shower;Door   [employer] Toilet: Standard Bathroom Accessibility: Yes How Accessible: Accessible via walker Home Equipment: Shower seat - built in;Toilet riser          Prior Functioning/Environment Level of Independence: Independent             OT Diagnosis: Acute pain   OT Problem List:     OT Treatment/Interventions:      OT Goals(Current goals can be found in the care plan section) Acute Rehab OT Goals OT Goal Formulation: All assessment and education complete, DC therapy  OT Frequency:     Barriers to D/C:            Co-evaluation              End of Session Equipment Utilized During Treatment: Rolling walker  Activity Tolerance: Patient tolerated treatment well Patient left: in chair;with call bell/phone within reach   Time: 4270-6237 OT Time Calculation (min): 28 min Charges:  OT General Charges $OT Visit: 1 Procedure OT Evaluation $OT Eval Moderate Complexity: 1 Procedure OT Treatments $Self Care/Home Management : 8-22 mins G-Codes:     Binnie Kand M.S., OTR/L Pager: [phone]  06/18/2015, 10:56 AM

## 2015-06-18 NOTE — Progress Notes (Signed)
Physical Therapy Treatment Patient Details Name: Nathan Sampson MRN: [id number] DOB: 14-May-1956 Today's Date: 06/18/2015    History of Present Illness pt admitted for R THA. PMHx: arthritis, CAD, HTN, DDD    PT Comments    Patient is making good progress with PT.  From a mobility standpoint anticipate patient will be ready for DC home when medically ready.     Follow Up Recommendations  Home health PT     Equipment Recommendations  3in1 (PT);Rolling walker with 5" wheels    Recommendations for Other Services OT consult     Precautions / Restrictions Precautions Precautions: Fall Restrictions Weight Bearing Restrictions: Yes RLE Weight Bearing: Weight bearing as tolerated    Mobility  Bed Mobility Overal bed mobility: Needs Assistance Bed Mobility: Supine to Sit     Supine to sit: Supervision     General bed mobility comments: vc for technique and hand placement with increased time needed; supervision for safety with HOB flat and no use of bedrails  Transfers Overall transfer level: Needs assistance Equipment used: Rolling walker (2 wheeled) Transfers: Sit to/from Stand Sit to Stand: Supervision         General transfer comment: good hand placement; vc for safe descent to chair  Ambulation/Gait Ambulation/Gait assistance: Supervision Ambulation Distance (Feet): 200 Feet Assistive device: Rolling walker (2 wheeled) Gait Pattern/deviations: Step-through pattern;Decreased step length - left;Decreased stance time - right;Decreased dorsiflexion - right   Gait velocity interpretation: Below normal speed for age/gender General Gait Details: vc for posture and increased R dorsiflexion with ability to correct with cues   Stairs Stairs: Yes Stairs assistance: Min assist Stair Management: No rails;Backwards;With walker Number of Stairs: 3 General stair comments: cues for sequence and assist for stabilizing RW  Wheelchair Mobility    Modified Rankin (Stroke  Patients Only)       Balance Overall balance assessment: Needs assistance Sitting-balance support: Feet supported Sitting balance-Leahy Scale: Good     Standing balance support: No upper extremity supported Standing balance-Leahy Scale: Fair Standing balance comment: Pt able to stand at sink and complete grooming activities without UE support                    Cognition Arousal/Alertness: Awake/alert Behavior During Therapy: WFL for tasks assessed/performed Overall Cognitive Status: Within Functional Limits for tasks assessed                      Exercises Total Joint Exercises Quad Sets: AROM;Right;10 reps;Supine Heel Slides: AROM;10 reps;Right;Supine Hip ABduction/ADduction: AROM;Right;10 reps;Standing Knee Flexion: AROM;Right;10 reps;Standing Marching in Standing: AROM;10 reps;Standing    General Comments        Pertinent Vitals/Pain Pain Assessment: 0-10 Pain Score: 7  Faces Pain Scale: Hurts little more Pain Location: R hip Pain Descriptors / Indicators: Sore Pain Intervention(s): Limited activity within patient's tolerance;Monitored during session;Premedicated before session;Repositioned    Home Living Family/patient expects to be discharged to:: Private residence Living Arrangements: Spouse/significant other Available Help at Discharge: Family;Available 24 hours/day Type of Home: House Home Access: Stairs to enter Entrance Stairs-Rails: Right Home Layout: Two level;Able to live on main level with bedroom/bathroom Home Equipment: Shower seat - built in;Toilet riser      Prior Function Level of Independence: Independent          PT Goals (current goals can now be found in the care plan section) Acute Rehab PT Goals Patient Stated Goal: go home Progress towards PT goals: Progressing toward goals  Frequency  7X/week    PT Plan Current plan remains appropriate    Co-evaluation             End of Session Equipment Utilized  During Treatment: Gait belt Activity Tolerance: Patient tolerated treatment well Patient left: in chair;with call bell/phone within reach     Time: 1400-1422 PT Time Calculation (min) (ACUTE ONLY): 22 min  Charges:  $Gait Training: 8-22 mins $Therapeutic Exercise: 8-22 mins                    G Codes:      Sal[place] April, PTA Pager: [phone]   06/18/2015, 2:31 PM

## 2015-06-25 ENCOUNTER — Other Ambulatory Visit: Payer: Self-pay | Admitting: Sports Medicine

## 2015-06-30 ENCOUNTER — Ambulatory Visit (INDEPENDENT_AMBULATORY_CARE_PROVIDER_SITE_OTHER): Payer: 59 | Admitting: Sports Medicine

## 2015-06-30 ENCOUNTER — Ambulatory Visit (HOSPITAL_BASED_OUTPATIENT_CLINIC_OR_DEPARTMENT_OTHER)
Admission: RE | Admit: 2015-06-30 | Discharge: 2015-06-30 | Disposition: A | Discharge status: Home / Self Care | Payer: 59 | Source: Ambulatory Visit | Attending: Sports Medicine | Admitting: Sports Medicine

## 2015-06-30 ENCOUNTER — Encounter (HOSPITAL_BASED_OUTPATIENT_CLINIC_OR_DEPARTMENT_OTHER): Payer: Self-pay

## 2015-06-30 ENCOUNTER — Other Ambulatory Visit: Payer: Self-pay | Admitting: Sports Medicine

## 2015-06-30 VITALS — BP 136/79 | HR 70 | Temp 97.9°F | Resp 18 | Wt 214.3 lb

## 2015-06-30 DIAGNOSIS — R0600 Dyspnea, unspecified: Secondary | ICD-10-CM | POA: Diagnosis not present

## 2015-06-30 DIAGNOSIS — I2510 Atherosclerotic heart disease of native coronary artery without angina pectoris: Secondary | ICD-10-CM | POA: Insufficient documentation

## 2015-06-30 DIAGNOSIS — R05 Cough: Secondary | ICD-10-CM | POA: Insufficient documentation

## 2015-06-30 DIAGNOSIS — M1611 Unilateral primary osteoarthritis, right hip: Secondary | ICD-10-CM

## 2015-06-30 MED ORDER — IOHEXOL 350 MG/ML SOLN
100.0000 mL | Freq: Once | INTRAVENOUS | Status: AC | PRN
  Administered 2015-06-30: 100 mL via INTRAVENOUS

## 2015-07-01 NOTE — Progress Notes (Signed)
Patient presented with chest pain, history of recent catheterization, recent total hip arthroplasty, obtained stat CT angiogram of the pulmonary arteries that showed no evidence of PE, aortic dissection. Pain was noncardiac in quality. Patient arrived one hour late for appointment, he will follow-up as needed.

## 2015-07-03 ENCOUNTER — Other Ambulatory Visit: Payer: Self-pay | Admitting: Sports Medicine

## 2015-07-09 ENCOUNTER — Other Ambulatory Visit: Payer: Self-pay | Admitting: Sports Medicine

## 2015-07-20 ENCOUNTER — Encounter: Payer: Self-pay | Admitting: Sports Medicine

## 2015-07-20 ENCOUNTER — Ambulatory Visit (INDEPENDENT_AMBULATORY_CARE_PROVIDER_SITE_OTHER): Payer: 59 | Admitting: Sports Medicine

## 2015-07-20 ENCOUNTER — Ambulatory Visit: Payer: 59

## 2015-07-20 VITALS — BP 131/83 | HR 82 | Resp 18 | Wt 214.0 lb

## 2015-07-20 DIAGNOSIS — M7989 Other specified soft tissue disorders: Secondary | ICD-10-CM | POA: Diagnosis not present

## 2015-07-20 DIAGNOSIS — J3089 Other allergic rhinitis: Secondary | ICD-10-CM | POA: Diagnosis not present

## 2015-07-20 DIAGNOSIS — Z96641 Presence of right artificial hip joint: Secondary | ICD-10-CM | POA: Diagnosis not present

## 2015-07-20 DIAGNOSIS — D500 Iron deficiency anemia secondary to blood loss (chronic): Secondary | ICD-10-CM

## 2015-07-20 MED ORDER — BECLOMETHASONE DIPROPIONATE 80 MCG/ACT NA AERS: 2.0000 | INHALATION_SPRAY | Freq: Every day | NASAL | Status: DC

## 2015-07-20 MED ORDER — FUSION PLUS PO CAPS: 1.0000 | ORAL_CAPSULE | Freq: Every day | ORAL | Status: DC

## 2015-07-20 NOTE — Assessment & Plan Note (Addendum)
Improved however not yet controlled, and hemoglobin was 9 postop, there is occasional tachycardia, as well as shortness of breath.  adding iron supplementation.  recheck in a month.

## 2015-07-20 NOTE — Assessment & Plan Note (Signed)
Negative CT angiogram, still with some swelling in the right leg, ordering lower extremity Dopplers.
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
## 2015-07-20 NOTE — Assessment & Plan Note (Signed)
With significant postnasal drip difficulty swallowing. Adding QNasl

## 2015-08-02 ENCOUNTER — Telehealth: Payer: Self-pay | Admitting: Sports Medicine

## 2015-08-02 NOTE — Telephone Encounter (Signed)
Received fax for prior authorization on Qnasl sent through cover my meds waiting on authorization. - CF

## 2015-08-03 NOTE — Telephone Encounter (Signed)
Received fax from [employer] and they approved coverage on Qnasl from 08/02/2015 - 08/01/2016 or until coverage is no longer available. File ID [id number]. Pharmacy notified - CF

## 2015-08-16 ENCOUNTER — Ambulatory Visit (INDEPENDENT_AMBULATORY_CARE_PROVIDER_SITE_OTHER): Payer: 59 | Admitting: Sports Medicine

## 2015-08-16 ENCOUNTER — Encounter: Payer: Self-pay | Admitting: Sports Medicine

## 2015-08-16 VITALS — BP 115/74 | HR 78 | Resp 16 | Wt 215.7 lb

## 2015-08-16 DIAGNOSIS — D500 Iron deficiency anemia secondary to blood loss (chronic): Secondary | ICD-10-CM | POA: Diagnosis not present

## 2015-08-16 DIAGNOSIS — L989 Disorder of the skin and subcutaneous tissue, unspecified: Secondary | ICD-10-CM

## 2015-08-16 DIAGNOSIS — C44311 Basal cell carcinoma of skin of nose: Secondary | ICD-10-CM | POA: Insufficient documentation

## 2015-08-16 DIAGNOSIS — Z0000 Encounter for general adult medical examination without abnormal findings: Secondary | ICD-10-CM | POA: Diagnosis not present

## 2015-08-16 LAB — POCT HEMOGLOBIN: Hemoglobin: 13.3 g/dL — AB (ref 14.1–18.1)

## 2015-08-16 MED ORDER — ZOLPIDEM TARTRATE 10 MG PO TABS: 10.0000 mg | ORAL_TABLET | Freq: Every evening | ORAL | Status: DC | PRN

## 2015-08-16 NOTE — Assessment & Plan Note (Signed)
Significantly improved with iron supplementation, shortness of breath has resolved.

## 2015-08-16 NOTE — Assessment & Plan Note (Signed)
Suspect early rodent ulcer, punch biopsy as above. Return in one week for nurse visit to remove sutures

## 2015-08-16 NOTE — Progress Notes (Signed)
  Subjective:    CC:  Follow-up  HPI: Dyspnea: Negative CT angiogram of the pulmonary arteries, had some acute blood loss anemia from his hip arthroplasty, we put him on aggressive iron supplementation and his shortness of breath has resolved.   Skin lesion: Left-sided the head, present for years, raw, painful, and unhealing.  Past medical history, Surgical history, Family history not pertinant except as noted below, Social history, Allergies, and medications have been entered into the medical record, reviewed, and no changes needed.   Review of Systems: No fevers, chills, night sweats, weight loss, chest pain, or shortness of breath.   Objective:    General: Well Developed, well nourished, and in no acute distress.  Neuro: Alert and oriented x3, extra-ocular muscles intact, sensation grossly intact.  HEENT: Normocephalic, atraumatic, pupils equal round reactive to light, neck supple, no masses, no lymphadenopathy, thyroid nonpalpable.  Skin: Warm and dry, no rashes. There is a flat, excoriated, family ulcerated lesion approximately 1 cm across on the left temple Cardiac: Regular rate and rhythm, no murmurs rubs or gallops, no lower extremity edema.  Respiratory: Clear to auscultation bilaterally. Not using accessory muscles, speaking in full sentences.  Procedure:   4 mm punch biopsy of left temporal nonhealing skin lesion Risks, benefits, and alternatives explained and consent obtained. Time out conducted. Surface prepped with alcohol. 2cc lidocaine with epinephine infiltrated in a field block. Adequate anesthesia ensured. Area prepped and draped in a sterile fashion. Excision performed with:  74mm punch biopsy I then closed the incision with a single 3-0 Ethilon simple interrupted stitch Hemostasis achieved. Pt stable.  Impression and Recommendations:

## 2015-08-23 ENCOUNTER — Encounter: Payer: Self-pay | Admitting: Sports Medicine

## 2015-08-23 ENCOUNTER — Ambulatory Visit: Payer: 59

## 2015-08-23 ENCOUNTER — Ambulatory Visit (INDEPENDENT_AMBULATORY_CARE_PROVIDER_SITE_OTHER): Payer: 59 | Admitting: Sports Medicine

## 2015-08-23 VITALS — BP 123/79 | HR 65 | Resp 18 | Wt 215.2 lb

## 2015-08-23 DIAGNOSIS — E669 Obesity, unspecified: Secondary | ICD-10-CM

## 2015-08-23 DIAGNOSIS — L570 Actinic keratosis: Secondary | ICD-10-CM

## 2015-08-23 MED ORDER — NALTREXONE-BUPROPION HCL ER 8-90 MG PO TB12: ORAL_TABLET | ORAL | Status: DC

## 2015-08-23 NOTE — Progress Notes (Addendum)
  Subjective:    CC: follow-up  HPI: Skin lesion: Doing well after excision, dermatopathology showed an actinic keratosis.  Obesity: Post coronary stenting, eager to try anything.  Past medical history, Surgical history, Family history not pertinant except as noted below, Social history, Allergies, and medications have been entered into the medical record, reviewed, and no changes needed.   Review of Systems: No fevers, chills, night sweats, weight loss, chest pain, or shortness of breath.   Objective:    General: Well Developed, well nourished, and in no acute distress.  Neuro: Alert and oriented x3, extra-ocular muscles intact, sensation grossly intact.  HEENT: Normocephalic, atraumatic, pupils equal round reactive to light, neck supple, no masses, no lymphadenopathy, thyroid nonpalpable.  Skin: Warm and dry, no rashes.  suture removed, wound is clean, dry, intact. Cardiac: Regular rate and rhythm, no murmurs rubs or gallops, no lower extremity edema.  Respiratory: Clear to auscultation bilaterally. Not using accessory muscles, speaking in full sentences.  Impression and Recommendations:    I spent 25 minutes with this patient, greater than 50% was face-to-face time counseling regarding the above diagnoses

## 2015-08-23 NOTE — Assessment & Plan Note (Signed)
Patient will return in a couple of weeks for cryotherapy there is another lesion on the side of his nose that we will freeze as well.

## 2015-08-23 NOTE — Assessment & Plan Note (Signed)
History of coronary artery disease, unable to use phentermine, starting Contrave Return in one month.

## 2015-09-21 ENCOUNTER — Other Ambulatory Visit: Payer: Self-pay | Admitting: Sports Medicine

## 2015-10-03 ENCOUNTER — Other Ambulatory Visit: Payer: Self-pay | Admitting: Sports Medicine

## 2015-10-06 ENCOUNTER — Other Ambulatory Visit: Payer: Self-pay | Admitting: Sports Medicine

## 2015-10-07 ENCOUNTER — Other Ambulatory Visit: Payer: Self-pay | Admitting: Sports Medicine

## 2015-10-07 MED ORDER — NALTREXONE-BUPROPION HCL ER 8-90 MG PO TB12: 2.0000 | ORAL_TABLET | Freq: Two times a day (BID) | ORAL | Status: DC

## 2015-11-02 ENCOUNTER — Other Ambulatory Visit: Payer: Self-pay | Admitting: Sports Medicine

## 2015-11-22 ENCOUNTER — Encounter: Payer: Self-pay | Admitting: Sports Medicine

## 2015-11-22 DIAGNOSIS — Z0000 Encounter for general adult medical examination without abnormal findings: Secondary | ICD-10-CM

## 2015-11-24 MED ORDER — TRETINOIN 0.1 % EX CREA: TOPICAL_CREAM | Freq: Every day | CUTANEOUS | Status: DC

## 2015-11-25 ENCOUNTER — Telehealth: Payer: Self-pay | Admitting: *Deleted

## 2015-11-25 NOTE — Telephone Encounter (Signed)
PA submitted for tretinoin;prob will be denied since use is cosmetic; wrinkles

## 2015-11-26 LAB — LIPID PANEL
Cholesterol: 127 mg/dL (ref 125–200)
HDL: 47 mg/dL (ref >=40)
LDL Cholesterol: 65 mg/dL (ref <130)
Total CHOL/HDL Ratio: 2.7 ratio (ref <=5.0)
Triglycerides: 77 mg/dL (ref <150)
VLDL: 15 mg/dL (ref <30)

## 2015-11-26 LAB — CBC
HCT: 42.1 % (ref 38.5–50.0)
Hemoglobin: 14 g/dL (ref 13.2–17.1)
MCH: 28.7 pg (ref 27.0–33.0)
MCHC: 33.3 g/dL (ref 32.0–36.0)
MCV: 86.4 fL (ref 80.0–100.0)
MPV: 9.9 fL (ref 7.5–12.5)
Platelets: 223 10*3/uL (ref 140–400)
RBC: 4.87 MIL/uL (ref 4.20–5.80)
RDW: 14.8 % (ref 11.0–15.0)
WBC: 6 K/uL (ref 3.8–10.8)

## 2015-11-26 LAB — COMPREHENSIVE METABOLIC PANEL WITH GFR
Albumin: 4 g/dL (ref 3.6–5.1)
CO2: 25 mmol/L (ref 20–31)
Calcium: 9 mg/dL (ref 8.6–10.3)
Chloride: 106 mmol/L (ref 98–110)

## 2015-11-26 LAB — TSH: TSH: 1.87 mIU/L (ref 0.40–4.50)

## 2015-11-26 LAB — COMPREHENSIVE METABOLIC PANEL
ALT: 28 U/L (ref 9–46)
AST: 21 U/L (ref 10–35)
Alkaline Phosphatase: 60 U/L (ref 40–115)
BUN: 19 mg/dL (ref 7–25)
Creat: 1.16 mg/dL (ref 0.70–1.25)
Glucose, Bld: 94 mg/dL (ref 65–99)
Potassium: 4.4 mmol/L (ref 3.5–5.3)
Sodium: 140 mmol/L (ref 135–146)
Total Bilirubin: 1 mg/dL (ref 0.2–1.2)
Total Protein: 6.2 g/dL (ref 6.1–8.1)

## 2015-11-26 LAB — URIC ACID: Uric Acid, Serum: 7.5 mg/dL (ref 4.0–8.0)

## 2015-11-27 LAB — HEPATITIS C ANTIBODY: HCV Ab: NEGATIVE

## 2015-11-27 LAB — HEMOGLOBIN A1C
Hgb A1c MFr Bld: 5.5 % (ref <5.7)
Mean Plasma Glucose: 111 mg/dL

## 2015-11-27 LAB — RPR

## 2015-11-27 LAB — VITAMIN D 25 HYDROXY (VIT D DEFICIENCY, FRACTURES): Vit D, 25-Hydroxy: 57 ng/mL (ref 30–100)

## 2015-11-27 LAB — HIV ANTIBODY (ROUTINE TESTING W REFLEX): HIV 1&2 Ab, 4th Generation: NONREACTIVE

## 2015-11-29 ENCOUNTER — Other Ambulatory Visit: Payer: Self-pay | Admitting: Sports Medicine

## 2015-11-29 ENCOUNTER — Encounter: Payer: Self-pay | Admitting: Sports Medicine

## 2015-11-29 LAB — GC/CHLAMYDIA PROBE AMP

## 2015-11-29 LAB — HSV(HERPES SMPLX)ABS-I+II(IGG+IGM)-BLD
HSV 1 Glycoprotein G Ab, IgG: 28.1 {index} — ABNORMAL HIGH (ref <0.90)
HSV 2 Glycoprotein G Ab, IgG: <0.9 {index} (ref <0.90)
Herpes Simplex Vrs I&II-IgM Ab (EIA): 0.67 INDEX

## 2015-12-01 ENCOUNTER — Encounter: Payer: 59 | Admitting: Sports Medicine

## 2015-12-03 ENCOUNTER — Ambulatory Visit (INDEPENDENT_AMBULATORY_CARE_PROVIDER_SITE_OTHER): Payer: 59 | Admitting: Sports Medicine

## 2015-12-03 ENCOUNTER — Encounter: Payer: Self-pay | Admitting: Sports Medicine

## 2015-12-03 VITALS — BP 142/82 | HR 61 | Ht 70.0 in | Wt 192.0 lb

## 2015-12-03 DIAGNOSIS — E785 Hyperlipidemia, unspecified: Secondary | ICD-10-CM

## 2015-12-03 DIAGNOSIS — I10 Essential (primary) hypertension: Secondary | ICD-10-CM | POA: Diagnosis not present

## 2015-12-03 DIAGNOSIS — M109 Gout, unspecified: Secondary | ICD-10-CM | POA: Insufficient documentation

## 2015-12-03 DIAGNOSIS — Z0000 Encounter for general adult medical examination without abnormal findings: Secondary | ICD-10-CM | POA: Diagnosis not present

## 2015-12-03 DIAGNOSIS — F988 Other specified behavioral and emotional disorders with onset usually occurring in childhood and adolescence: Secondary | ICD-10-CM

## 2015-12-03 DIAGNOSIS — I2510 Atherosclerotic heart disease of native coronary artery without angina pectoris: Secondary | ICD-10-CM | POA: Diagnosis not present

## 2015-12-03 DIAGNOSIS — F909 Attention-deficit hyperactivity disorder, unspecified type: Secondary | ICD-10-CM

## 2015-12-03 DIAGNOSIS — M1000 Idiopathic gout, unspecified site: Secondary | ICD-10-CM

## 2015-12-03 DIAGNOSIS — E669 Obesity, unspecified: Secondary | ICD-10-CM

## 2015-12-03 MED ORDER — RETINOIC ACID POWD: Status: DC

## 2015-12-03 MED ORDER — NEBIVOLOL HCL 5 MG PO TABS: 5.0000 mg | ORAL_TABLET | Freq: Every day | ORAL | Status: DC

## 2015-12-03 MED ORDER — ASPIRIN EC 81 MG PO TBEC: 81.0000 mg | DELAYED_RELEASE_TABLET | Freq: Every day | ORAL | Status: DC

## 2015-12-03 MED ORDER — ATORVASTATIN CALCIUM 40 MG PO TABS: 40.0000 mg | ORAL_TABLET | Freq: Every day | ORAL | Status: DC

## 2015-12-03 MED ORDER — ALLOPURINOL 100 MG PO TABS: 100.0000 mg | ORAL_TABLET | Freq: Every day | ORAL | Status: DC

## 2015-12-03 NOTE — Assessment & Plan Note (Signed)
Starting low-dose allopurinol, recheck uric acid levels in one month.

## 2015-12-03 NOTE — Assessment & Plan Note (Addendum)
Initially went off of his beta blocker, advised that this was a bad idea, he has lost a great deal of weight and we have discontinued his ramipril due to hypotension, but he needs to continue beta blocker, I am switching him to bystolic. Has already failed metoprolol and carvedilol

## 2015-12-03 NOTE — Assessment & Plan Note (Signed)
Well-controlled on Lipitor. Decreasing to 40 mg.

## 2015-12-03 NOTE — Assessment & Plan Note (Signed)
Incredible 40 pound weight loss on Contrave, no changes.

## 2015-12-03 NOTE — Progress Notes (Signed)
  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 He is also noted that his attention deficit symptoms have resolved.  Hyperlipidemia: Extremely well controlled, agreeable to drop down from 80 mg.  Hypertension: Weight loss is allowed him to come off of all of his blood pressure medications except Coreg. He only takes 12.5 mg of Coreg once per day.  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  Gout: Uric acid levels were 7.5, he does have a low level constant joint pain.  And gets approximately 2 flares of podagra per year.  Past medical history, Surgical history, Family history not pertinant except as noted below, Social history, Allergies, and medications have been entered into the medical record, reviewed, and no changes needed.   Review of Systems: No headache, visual changes, nausea, vomiting, diarrhea, constipation, dizziness, abdominal pain, skin rash, fevers, chills, night sweats, swollen lymph nodes, weight loss, chest pain, body aches, joint swelling, muscle aches, shortness of breath, mood changes, visual or auditory hallucinations.  Objective:    General: Well Developed, well nourished, and in no acute distress.  Neuro: Alert and oriented x3, extra-ocular muscles intact, sensation grossly intact. Cranial nerves II through XII are intact, motor, sensory, and coordinative functions are all intact. HEENT: Normocephalic, atraumatic, pupils equal round reactive to light, neck supple, no masses, no lymphadenopathy, thyroid nonpalpable. Oropharynx, nasopharynx, external ear canals are unremarkable. Skin: Warm and dry, no rashes noted.  Cardiac: Regular rate and rhythm, no murmurs rubs or gallops.  Respiratory: Clear to auscultation  bilaterally. Not using accessory muscles, speaking in full sentences.  Abdominal: Soft, nontender, nondistended, positive bowel sounds, no masses, no organomegaly.  Musculoskeletal: Shoulder, elbow, wrist, hip, knee, ankle stable, and with full range of motion. Rectal: Good tone, smooth prostate, Hemoccult negative.  Impression and Recommendations:    The patient was counselled, risk factors were discussed, anticipatory guidance given.

## 2015-12-03 NOTE — Assessment & Plan Note (Signed)
Has come off of essentially all blood pressure medications.  Still slightly elevated, switching from Coreg to Bystolic, return in 2 weeks for a blood pressure check.

## 2015-12-03 NOTE — Assessment & Plan Note (Signed)
Essentially resolved with Contrave

## 2015-12-03 NOTE — Assessment & Plan Note (Signed)
Complete physical as above.  Blood work is essentially perfect. Hemoccult negative. He has lost a great deal of weight

## 2015-12-10 NOTE — Telephone Encounter (Signed)
Denied and resubmitted with dx AK's approved. Pt notified

## 2015-12-17 ENCOUNTER — Ambulatory Visit: Payer: 59 | Admitting: Sports Medicine

## 2015-12-20 ENCOUNTER — Ambulatory Visit (INDEPENDENT_AMBULATORY_CARE_PROVIDER_SITE_OTHER): Payer: 59 | Admitting: Sports Medicine

## 2015-12-20 ENCOUNTER — Encounter: Payer: Self-pay | Admitting: Sports Medicine

## 2015-12-20 VITALS — BP 113/67 | HR 51 | Resp 16 | Wt 193.7 lb

## 2015-12-20 DIAGNOSIS — I2510 Atherosclerotic heart disease of native coronary artery without angina pectoris: Secondary | ICD-10-CM

## 2015-12-20 DIAGNOSIS — L570 Actinic keratosis: Secondary | ICD-10-CM

## 2015-12-20 NOTE — Progress Notes (Signed)
  Subjective:    CC: Followup  HPI: Skin lesions: History of actinic keratoses on the left side of the nose and left temple, desires cryotherapy today.  Coronary artery disease: Is off of all blood pressure medications with weight loss, vitals are completely stable now with bystolic. No orthostasis.  Past medical history, Surgical history, Family history not pertinant except as noted below, Social history, Allergies, and medications have been entered into the medical record, reviewed, and no changes needed.   Review of Systems: No fevers, chills, night sweats, weight loss, chest pain, or shortness of breath.   Objective:    General: Well Developed, well nourished, and in no acute distress.  Neuro: Alert and oriented x3, extra-ocular muscles intact, sensation grossly intact.  HEENT: Normocephalic, atraumatic, pupils equal round reactive to light, neck supple, no masses, no lymphadenopathy, thyroid nonpalpable.  Skin: Warm and dry, no rashes. Cardiac: Regular rate and rhythm, no murmurs rubs or gallops, no lower extremity edema.  Respiratory: Clear to auscultation bilaterally. Not using accessory muscles, speaking in full sentences.  Procedure:  Cryodestruction of left nose and left temple actinic keratosis Consent obtained and verified. Time-out conducted. Noted no overlying erythema, induration, or other signs of local infection. Completed without difficulty using Cryo-Gun. Advised to call if fevers/chills, erythema, induration, drainage, or persistent bleeding.  Impression and Recommendations:    I spent 25 minutes with this patient, greater than 50% was face-to-face time counseling regarding the above diagnoses

## 2015-12-20 NOTE — Assessment & Plan Note (Signed)
Vitals are reviewed with the controlled with bystolic, he is off of all of his blood pressure medications

## 2015-12-20 NOTE — Assessment & Plan Note (Signed)
Cryotherapy as above to a left nose and left temple actinic keratosis.

## 2016-01-28 ENCOUNTER — Encounter: Payer: Self-pay | Admitting: Sports Medicine

## 2016-01-28 ENCOUNTER — Other Ambulatory Visit: Payer: Self-pay | Admitting: Sports Medicine

## 2016-01-28 MED ORDER — ZOLPIDEM TARTRATE 10 MG PO TABS: 10.0000 mg | ORAL_TABLET | Freq: Every evening | ORAL | 0 refills | Status: DC | PRN

## 2016-01-28 MED ORDER — TRETINOIN MICROSPHERE 0.1 % EX GEL: Freq: Every day | CUTANEOUS | 11 refills | Status: DC

## 2016-02-01 ENCOUNTER — Encounter: Payer: Self-pay | Admitting: Sports Medicine

## 2016-02-01 DIAGNOSIS — F39 Unspecified mood [affective] disorder: Secondary | ICD-10-CM

## 2016-02-09 NOTE — Addendum Note (Signed)
Addended by: Silverio Decamp on: 02/09/2016 11:28 AM   Modules accepted: Orders

## 2016-03-04 ENCOUNTER — Encounter: Payer: Self-pay | Admitting: Sports Medicine

## 2016-03-04 DIAGNOSIS — R1011 Right upper quadrant pain: Secondary | ICD-10-CM

## 2016-03-06 MED ORDER — PROMETHAZINE HCL 25 MG PO TABS: 25.0000 mg | ORAL_TABLET | Freq: Four times a day (QID) | ORAL | 3 refills | Status: DC | PRN

## 2016-03-06 MED ORDER — ONDANSETRON 8 MG PO TBDP: 8.0000 mg | ORAL_TABLET | Freq: Three times a day (TID) | ORAL | 3 refills | Status: DC | PRN

## 2016-03-16 ENCOUNTER — Ambulatory Visit (INDEPENDENT_AMBULATORY_CARE_PROVIDER_SITE_OTHER): Payer: 59 | Admitting: Licensed Clinical Social Worker

## 2016-03-16 ENCOUNTER — Other Ambulatory Visit: Payer: 59

## 2016-03-16 DIAGNOSIS — F4322 Adjustment disorder with anxiety: Secondary | ICD-10-CM

## 2016-03-16 DIAGNOSIS — F909 Attention-deficit hyperactivity disorder, unspecified type: Secondary | ICD-10-CM | POA: Diagnosis not present

## 2016-03-17 ENCOUNTER — Encounter (HOSPITAL_COMMUNITY): Payer: Self-pay | Admitting: Licensed Clinical Social Worker

## 2016-03-17 DIAGNOSIS — F4322 Adjustment disorder with anxiety: Secondary | ICD-10-CM | POA: Insufficient documentation
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
## 2016-03-20 ENCOUNTER — Ambulatory Visit (INDEPENDENT_AMBULATORY_CARE_PROVIDER_SITE_OTHER): Payer: 59

## 2016-03-20 DIAGNOSIS — N281 Cyst of kidney, acquired: Secondary | ICD-10-CM

## 2016-03-20 DIAGNOSIS — R1011 Right upper quadrant pain: Secondary | ICD-10-CM

## 2016-03-22 ENCOUNTER — Ambulatory Visit (INDEPENDENT_AMBULATORY_CARE_PROVIDER_SITE_OTHER): Payer: 59 | Admitting: Licensed Clinical Social Worker

## 2016-03-22 DIAGNOSIS — F4322 Adjustment disorder with anxiety: Secondary | ICD-10-CM | POA: Diagnosis not present

## 2016-03-22 NOTE — Progress Notes (Signed)
   THERAPIST PROGRESS NOTE  Session Time: 9:15am-10:00am  Participation Level: Active  Behavioral Response: CasualAlertEuthymic  Type of Therapy: Individual Therapy  Treatment Goals addressed: Adjusting to relationship changes  Interventions: Treatment planning, solution focused   Suicidal/Homicidal: Denied both  Therapist Interventions:  Collaborated with patient to develop his treatment plan. Introduced a technique called the broken record which can be used to respond to others when they don't respect you.  Discussed the idea of developing a list of requests for her to change her behavior in order to establish clearer boundaries.  Noted that if she does not go along with his requests he may have to put consequences into place.  Summary: Decided on the following treatment goal: Nathan Sampson will report feeling satisfied with how he is coping with adjusting to separating from his wife.  Realizes he needs to set some clearer boundaries with his wife.  Receptive to the idea of coming up with phrases he can use to respond to his wife when she has unrealistic expectations.  Will emphasize that they will no longer be making decisions together, that his decisions are his decisions. Plans to make up a list of expectations for her behavior sometime before Saturday when he is expecting his wife to be back in town.    Plan: Next appointment is scheduled in approximately 3 weeks.  Diagnosis: Adjustment disorder with anxious mood    Armandina Stammer 03/22/2016

## 2016-04-04 ENCOUNTER — Ambulatory Visit (HOSPITAL_COMMUNITY): Payer: Self-pay | Admitting: Licensed Clinical Social Worker

## 2016-04-10 ENCOUNTER — Ambulatory Visit (HOSPITAL_COMMUNITY): Payer: Self-pay | Admitting: Licensed Clinical Social Worker

## 2016-04-21 ENCOUNTER — Ambulatory Visit (INDEPENDENT_AMBULATORY_CARE_PROVIDER_SITE_OTHER): Payer: 59 | Admitting: Sports Medicine

## 2016-04-21 DIAGNOSIS — H60591 Other noninfective acute otitis externa, right ear: Secondary | ICD-10-CM | POA: Diagnosis not present

## 2016-04-21 DIAGNOSIS — J309 Allergic rhinitis, unspecified: Secondary | ICD-10-CM | POA: Diagnosis not present

## 2016-04-21 DIAGNOSIS — B356 Tinea cruris: Secondary | ICD-10-CM | POA: Diagnosis not present

## 2016-04-21 MED ORDER — CLOTRIMAZOLE-BETAMETHASONE 1-0.05 % EX CREA: 1.0000 "application " | TOPICAL_CREAM | Freq: Two times a day (BID) | CUTANEOUS | 0 refills | Status: DC

## 2016-04-21 MED ORDER — CIPROFLOXACIN-DEXAMETHASONE 0.3-0.1 % OT SUSP: 4.0000 [drp] | Freq: Two times a day (BID) | OTIC | 0 refills | Status: AC

## 2016-04-21 MED ORDER — TRIAMCINOLONE ACETONIDE 55 MCG/ACT NA AERO: 2.0000 | INHALATION_SPRAY | Freq: Every day | NASAL | 12 refills | Status: AC

## 2016-04-21 NOTE — Assessment & Plan Note (Signed)
With a bit of blood in the canal. Likely from using a Q-tip. Adding topical Ciprodex, tympanic membrane is intact.

## 2016-04-21 NOTE — Assessment & Plan Note (Signed)
Adding topical Lotrisone

## 2016-04-21 NOTE — Progress Notes (Signed)
  Subjective:    CC: Right ear pain  HPI: This is a pleasant 59 year old male, for the past several days he's had some pain in his right ear, with a bit of drainage, he is to Q-tip and then made it bleed. Denies any sinus pain or pressure that's new, no new onset cough. Slight nasal congestion. No constitutional symptoms, shortness of breath, chest pain, rashes, fevers or chills.  Tinea cruris: Needs a refill on Lotrisone.  Past medical history:  Negative.  See flowsheet/record as well for more information.  Surgical history: Negative.  See flowsheet/record as well for more information.  Family history: Negative.  See flowsheet/record as well for more information.  Social history: Negative.  See flowsheet/record as well for more information.  Allergies, and medications have been entered into the medical record, reviewed, and no changes needed.   Review of Systems: No fevers, chills, night sweats, weight loss, chest pain, or shortness of breath.   Objective:    General: Well Developed, well nourished, and in no acute distress.  Neuro: Alert and oriented x3, extra-ocular muscles intact, sensation grossly intact.  HEENT: Normocephalic, atraumatic, pupils equal round reactive to light, neck supple, no masses, no lymphadenopathy, thyroid nonpalpable. Oropharynx, nasopharynx unremarkable, right ear canal has a bit of dried blood, tympanic membrane is intact. Only minimal to no erythema of the canal itself. No tenderness to palpation over the maxillary or frontal sinuses Skin: Warm and dry, no rashes. Cardiac: Regular rate and rhythm, no murmurs rubs or gallops, no lower extremity edema.  Respiratory: Clear to auscultation bilaterally. Not using accessory muscles, speaking in full sentences.  Impression and Recommendations:    Acute irritant otitis externa, right With a bit of blood in the canal. Likely from using a Q-tip. Adding topical Ciprodex, tympanic membrane is intact.  Allergic  rhinitis Inadequate responses to Qnasl and Flonase. Switching to Nasacort.  Tinea cruris Adding topical Lotrisone

## 2016-04-21 NOTE — Assessment & Plan Note (Signed)
Inadequate responses to Qnasl and Flonase. Switching to Nasacort.

## 2016-04-30 ENCOUNTER — Other Ambulatory Visit: Payer: Self-pay | Admitting: Sports Medicine

## 2016-06-26 ENCOUNTER — Encounter (HOSPITAL_COMMUNITY): Payer: Self-pay | Admitting: Licensed Clinical Social Worker

## 2016-07-01 ENCOUNTER — Other Ambulatory Visit: Payer: Self-pay | Admitting: Sports Medicine

## 2016-07-01 DIAGNOSIS — M1000 Idiopathic gout, unspecified site: Secondary | ICD-10-CM

## 2016-08-02 ENCOUNTER — Other Ambulatory Visit: Payer: Self-pay | Admitting: Sports Medicine

## 2016-09-05 ENCOUNTER — Other Ambulatory Visit: Payer: Self-pay

## 2016-09-05 ENCOUNTER — Telehealth: Payer: Self-pay | Admitting: Cardiology

## 2016-09-05 DIAGNOSIS — E785 Hyperlipidemia, unspecified: Secondary | ICD-10-CM

## 2016-09-05 MED ORDER — ATORVASTATIN CALCIUM 40 MG PO TABS: 40.0000 mg | ORAL_TABLET | Freq: Every day | ORAL | 3 refills | Status: DC

## 2016-09-05 NOTE — Telephone Encounter (Signed)
New message      Pt is calling to see if he is due to have a stress test.  He has increased his activity a lot since his last visit.  Pt also has lost approx 30 lbs since last visit. Please call

## 2016-09-05 NOTE — Telephone Encounter (Signed)
Spoke with pt states that he is due for stress test. Pt has not been seen for 2 years. Scheduled appt 4-13 @ 8am he will discuss stress test at appointment

## 2016-09-06 ENCOUNTER — Other Ambulatory Visit: Payer: Self-pay

## 2016-09-06 DIAGNOSIS — I2510 Atherosclerotic heart disease of native coronary artery without angina pectoris: Secondary | ICD-10-CM

## 2016-09-06 MED ORDER — NEBIVOLOL HCL 5 MG PO TABS: 5.0000 mg | ORAL_TABLET | Freq: Every day | ORAL | 3 refills | Status: DC

## 2016-09-15 ENCOUNTER — Telehealth (HOSPITAL_COMMUNITY): Payer: Self-pay

## 2016-09-15 ENCOUNTER — Ambulatory Visit (INDEPENDENT_AMBULATORY_CARE_PROVIDER_SITE_OTHER): Payer: 59 | Admitting: Cardiology

## 2016-09-15 ENCOUNTER — Encounter: Payer: Self-pay | Admitting: Cardiology

## 2016-09-15 VITALS — BP 122/82 | HR 59 | Ht 70.0 in | Wt 212.8 lb

## 2016-09-15 DIAGNOSIS — E785 Hyperlipidemia, unspecified: Secondary | ICD-10-CM | POA: Diagnosis not present

## 2016-09-15 DIAGNOSIS — Z9861 Coronary angioplasty status: Secondary | ICD-10-CM | POA: Diagnosis not present

## 2016-09-15 DIAGNOSIS — I2510 Atherosclerotic heart disease of native coronary artery without angina pectoris: Secondary | ICD-10-CM | POA: Diagnosis not present

## 2016-09-15 DIAGNOSIS — R5383 Other fatigue: Secondary | ICD-10-CM | POA: Diagnosis not present

## 2016-09-15 DIAGNOSIS — I10 Essential (primary) hypertension: Secondary | ICD-10-CM | POA: Diagnosis not present

## 2016-09-15 MED ORDER — NITROGLYCERIN 0.4 MG SL SUBL: 0.4000 mg | SUBLINGUAL_TABLET | SUBLINGUAL | 1 refills | Status: AC | PRN

## 2016-09-15 NOTE — Assessment & Plan Note (Signed)
Controlled.  

## 2016-09-15 NOTE — Telephone Encounter (Signed)
Encounter complete. 
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
## 2016-09-15 NOTE — Assessment & Plan Note (Signed)
Possible anginal equivalent.

## 2016-09-15 NOTE — Assessment & Plan Note (Signed)
Status post stenting of the mid LAD and distal right coronary with drug-eluting stents in July 2009.   Myoview low risk 2013, GXT negative 2016

## 2016-09-15 NOTE — Patient Instructions (Signed)
Medication Instructions:  Your physician recommends that you continue on your current medications as directed. Please refer to the Current Medication list given to you today.  Labwork: NONE  Testing/Procedures: Your physician has requested that you have en exercise stress myoview. For further information please visit [email or web address]. Please follow instruction sheet, as given.   Follow-Up: Your physician recommends that you schedule a follow-up appointment: After stress test with Dr. Martinique.   Any Other Special Instructions Will Be Listed Below (If Applicable).     If you need a refill on your cardiac medications before your next appointment, please call your pharmacy.

## 2016-09-15 NOTE — Assessment & Plan Note (Signed)
LDL 65 June 2017

## 2016-09-19 ENCOUNTER — Ambulatory Visit (HOSPITAL_COMMUNITY)
Admission: RE | Admit: 2016-09-19 | Discharge: 2016-09-19 | Disposition: A | Discharge status: Home / Self Care | Payer: 59 | Source: Ambulatory Visit | Attending: Cardiology | Admitting: Cardiology

## 2016-09-19 DIAGNOSIS — R5383 Other fatigue: Secondary | ICD-10-CM | POA: Insufficient documentation

## 2016-09-19 DIAGNOSIS — I2510 Atherosclerotic heart disease of native coronary artery without angina pectoris: Secondary | ICD-10-CM | POA: Diagnosis not present

## 2016-09-19 DIAGNOSIS — Z9861 Coronary angioplasty status: Secondary | ICD-10-CM | POA: Insufficient documentation

## 2016-09-19 LAB — MYOCARDIAL PERFUSION IMAGING
Estimated workload: 12.5 METS
Exercise duration (min): 10 min
Exercise duration (sec): 31 s
LV dias vol: 100 mL (ref 62–150)
LV sys vol: 50 mL
MPHR: 158 {beats}/min
Peak HR: 160 {beats}/min
Percent HR: 101 %
RPE: 17
Rest HR: 52 {beats}/min
SDS: 4
SRS: 0
SSS: 4
TID: 0.95

## 2016-09-19 MED ORDER — TECHNETIUM TC 99M TETROFOSMIN IV KIT
10.8000 | PACK | Freq: Once | INTRAVENOUS | Status: AC | PRN
  Administered 2016-09-19: 10.8 via INTRAVENOUS
  Filled 2016-09-19: qty 11

## 2016-09-19 MED ORDER — TECHNETIUM TC 99M TETROFOSMIN IV KIT
30.1000 | PACK | Freq: Once | INTRAVENOUS | Status: DC | PRN
  Filled 2016-09-19: qty 31

## 2016-09-19 MED ORDER — TECHNETIUM TC 99M TETROFOSMIN IV KIT
31.0000 | PACK | Freq: Once | INTRAVENOUS | Status: AC | PRN
  Administered 2016-09-19: 31 via INTRAVENOUS
  Filled 2016-09-19: qty 31

## 2016-09-19 MED ORDER — TECHNETIUM TC 99M TETROFOSMIN IV KIT
10.4000 | PACK | Freq: Once | INTRAVENOUS | Status: DC | PRN
  Filled 2016-09-19: qty 11
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
## 2016-10-02 ENCOUNTER — Encounter: Payer: Self-pay | Admitting: Cardiology

## 2016-10-02 ENCOUNTER — Ambulatory Visit (INDEPENDENT_AMBULATORY_CARE_PROVIDER_SITE_OTHER): Payer: 59 | Admitting: Cardiology

## 2016-10-02 VITALS — BP 120/78 | HR 60 | Ht 70.0 in | Wt 215.0 lb

## 2016-10-02 DIAGNOSIS — Z9861 Coronary angioplasty status: Secondary | ICD-10-CM

## 2016-10-02 DIAGNOSIS — I2510 Atherosclerotic heart disease of native coronary artery without angina pectoris: Secondary | ICD-10-CM | POA: Diagnosis not present

## 2016-10-02 DIAGNOSIS — I10 Essential (primary) hypertension: Secondary | ICD-10-CM

## 2016-10-02 NOTE — Patient Instructions (Addendum)
Continue your current therapy  I will see you in one year   

## 2016-10-06 ENCOUNTER — Other Ambulatory Visit: Payer: Self-pay | Admitting: Sports Medicine

## 2016-10-30 ENCOUNTER — Other Ambulatory Visit: Payer: Self-pay | Admitting: Sports Medicine

## 2016-11-27 ENCOUNTER — Other Ambulatory Visit: Payer: Self-pay | Admitting: Sports Medicine

## 2016-12-18 ENCOUNTER — Encounter: Payer: Self-pay | Admitting: Sports Medicine

## 2016-12-27 ENCOUNTER — Telehealth: Payer: Self-pay | Admitting: Sports Medicine
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
## 2016-12-29 NOTE — Telephone Encounter (Signed)
I left a message for the attorney, he will call me back.

## 2016-12-30 ENCOUNTER — Other Ambulatory Visit: Payer: Self-pay | Admitting: Sports Medicine

## 2016-12-30 DIAGNOSIS — M1000 Idiopathic gout, unspecified site: Secondary | ICD-10-CM

## 2017-01-02 ENCOUNTER — Telehealth: Payer: Self-pay

## 2017-01-02 NOTE — Telephone Encounter (Signed)
[employer] is a non- preferred agent. They will cover the cost at a higher cost to the patient.

## 2017-01-02 NOTE — Telephone Encounter (Signed)
This was covered previously, has something changed with his insurance? Would he like to just have the prescription and pay for it?

## 2017-01-03 NOTE — Telephone Encounter (Signed)
Patient states he will call the pharmacy to find out what is going on.

## 2017-01-06 ENCOUNTER — Other Ambulatory Visit: Payer: Self-pay | Admitting: Sports Medicine

## 2017-01-08 IMAGING — CR DG CHEST 2V
2 series · 2 of 2 positions shown · non-contrast
Comparison: 10/09/2011

CLINICAL DATA: Preop examination.  Cough.

EXAM:
CHEST  2 VIEW

[w chest pa]
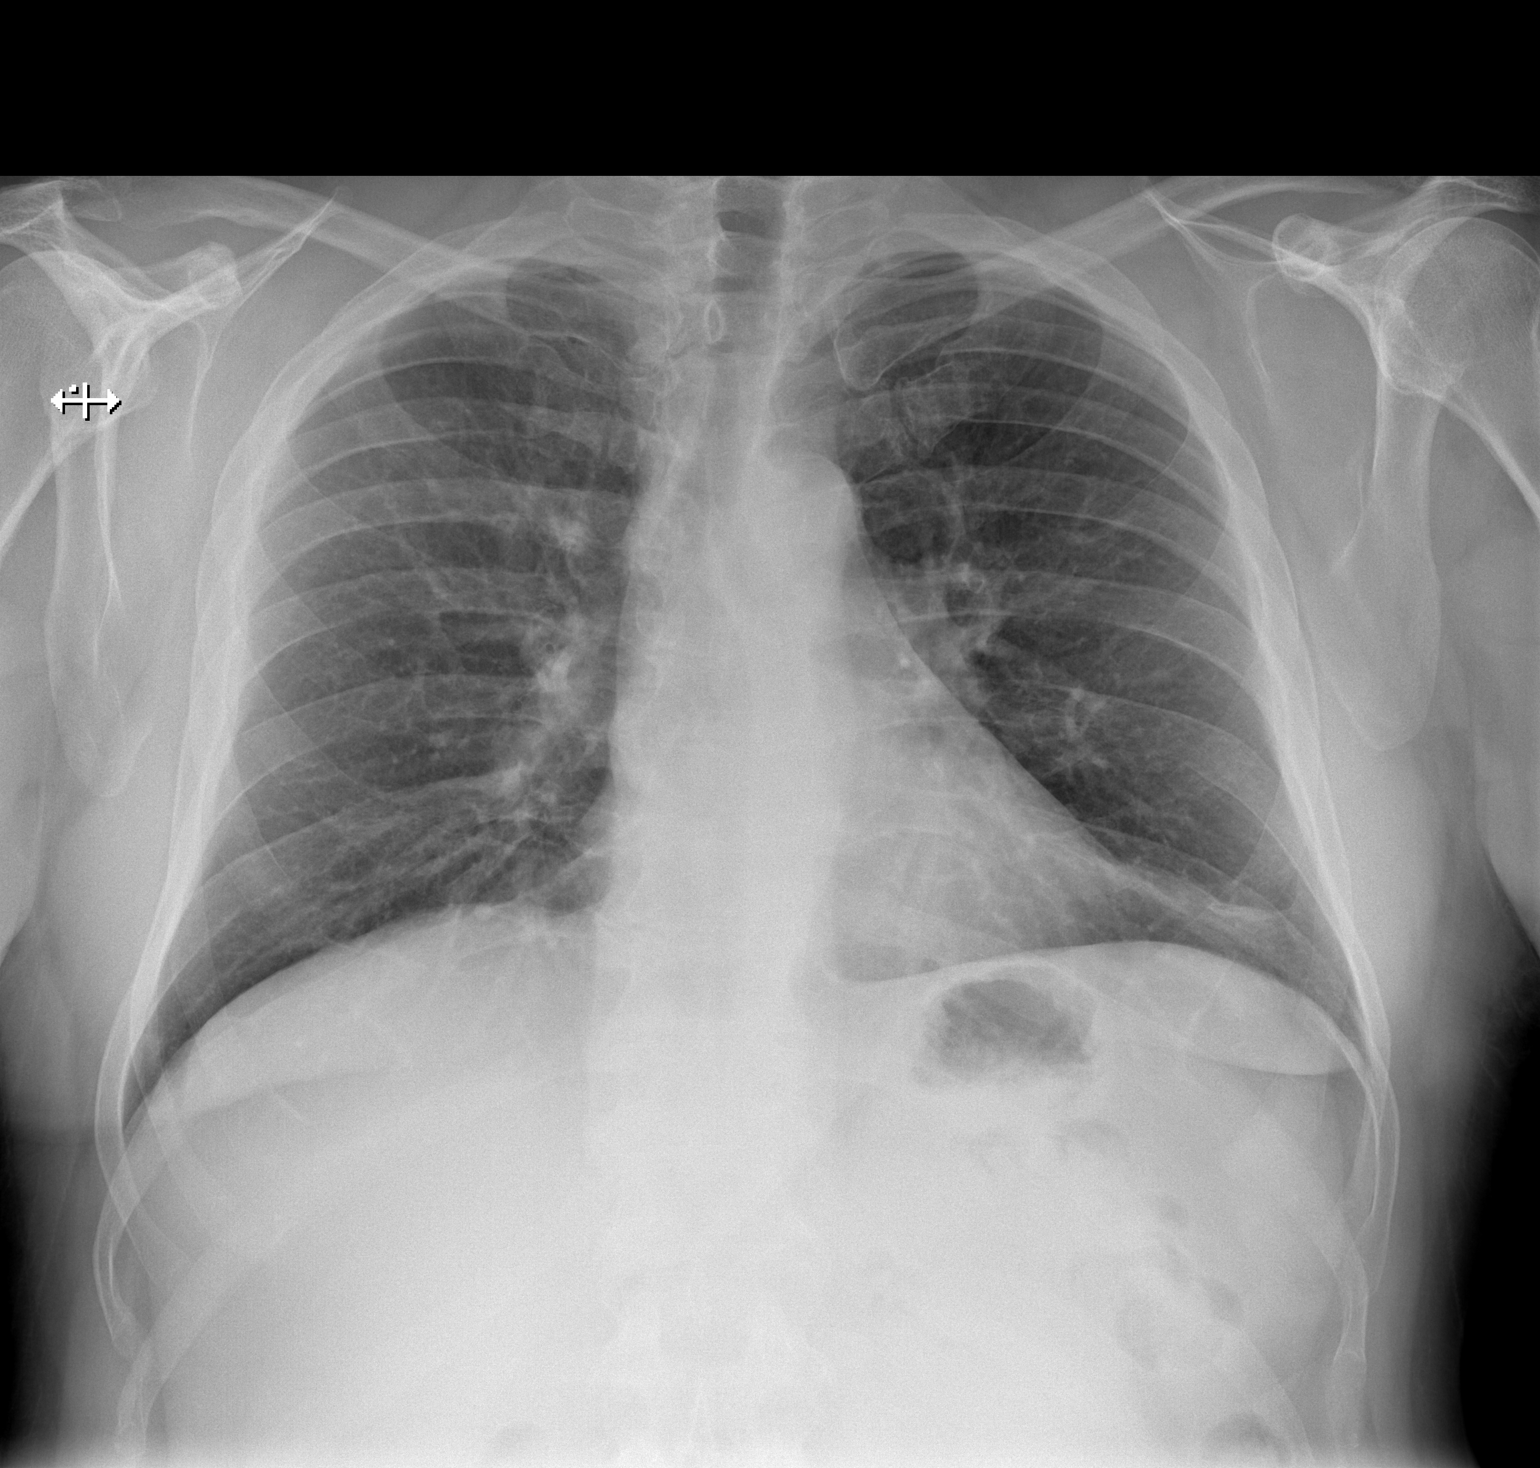

[w chest lat]
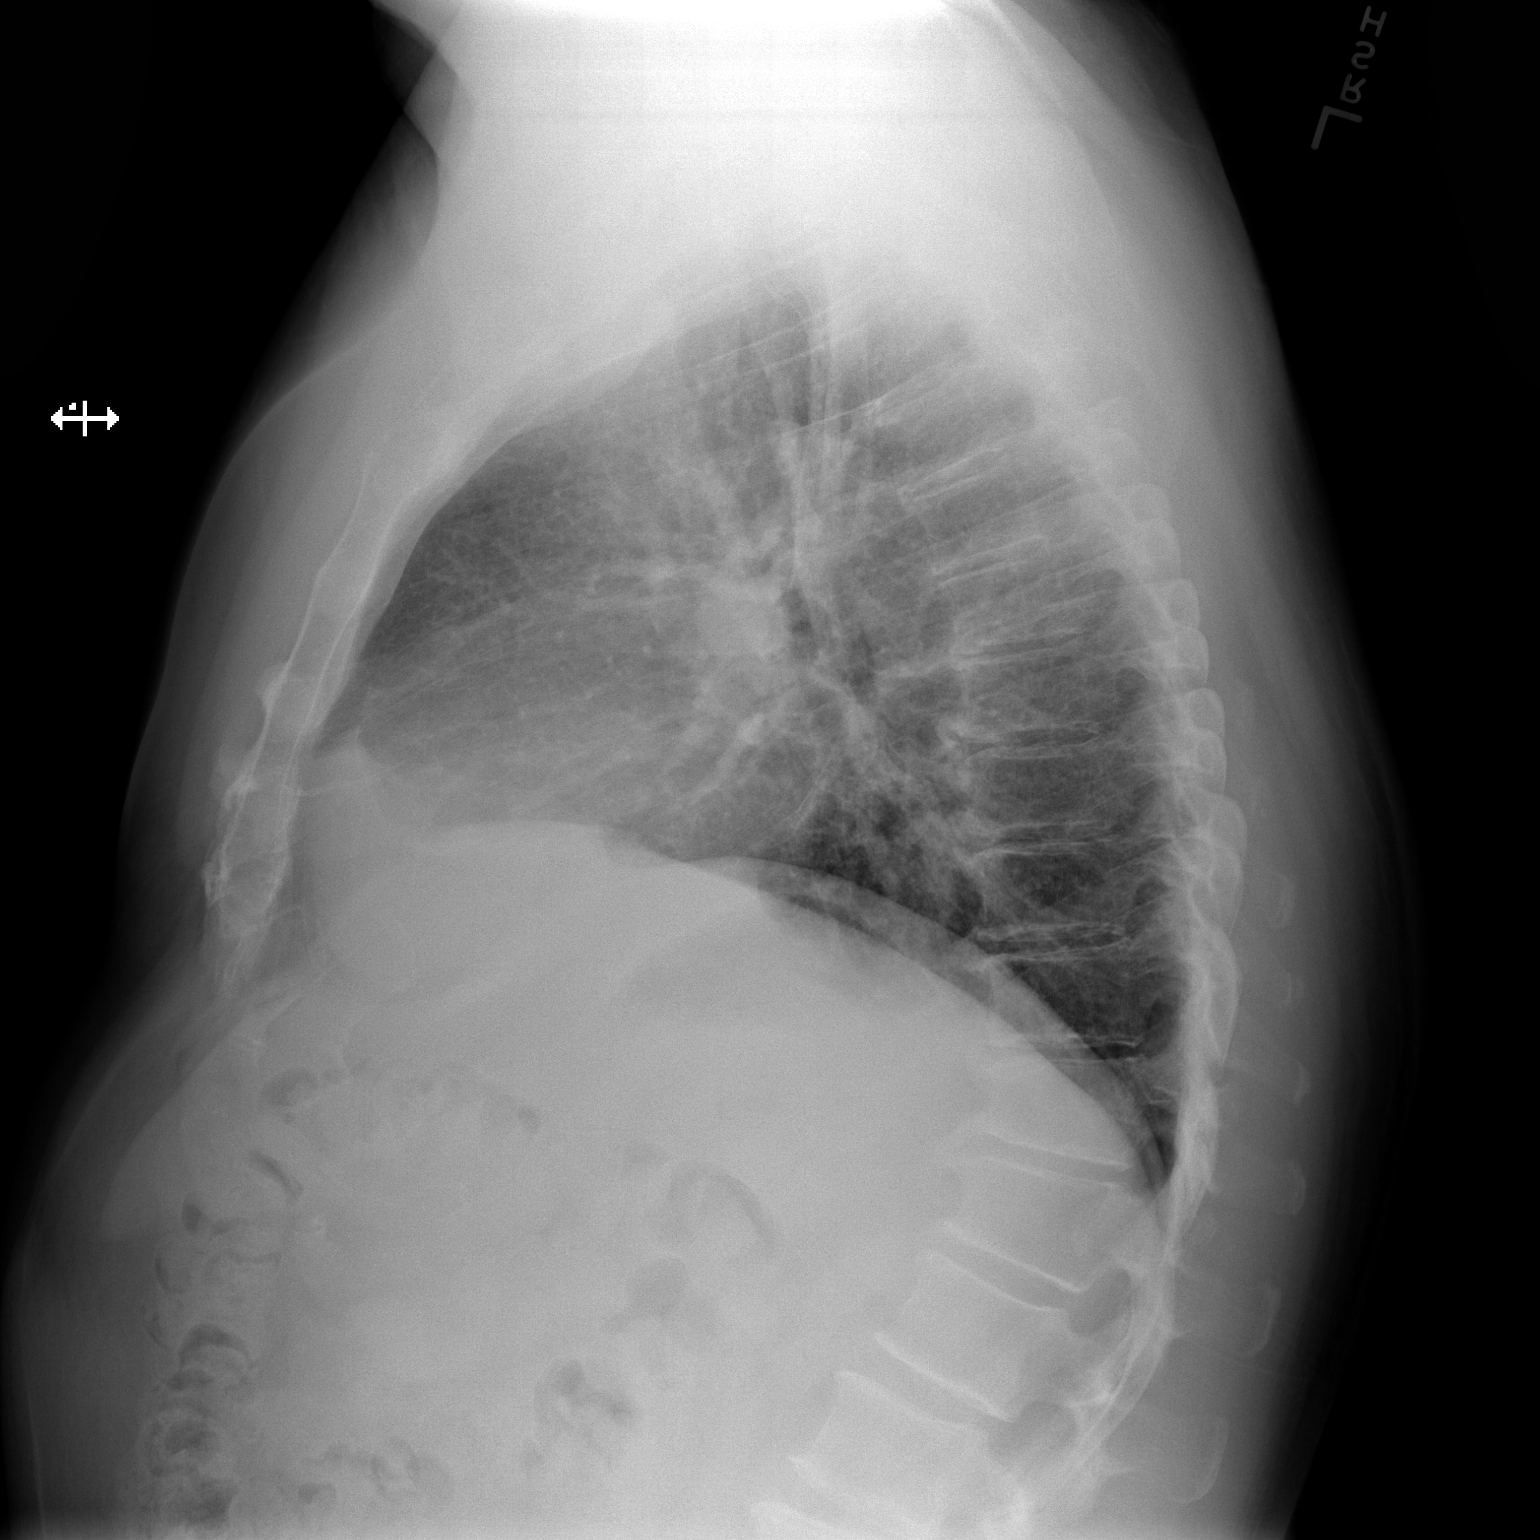

[2 of 2 positions shown; findings below may reference images not displayed]

FINDINGS: Linear scarring at the left base. Right lung is clear. Heart is
normal size. No effusions. No acute bony abnormality.
IMPRESSION: Lingular scarring.  No active disease.

## 2017-01-20 IMAGING — CR DG PORTABLE PELVIS
2 series · 2 of 2 positions shown · non-contrast
Comparison: None.

CLINICAL DATA: Post of right hip total arthroplasty

EXAM:
PORTABLE PELVIS 1-2 VIEWS

[AP (1 of 2)]
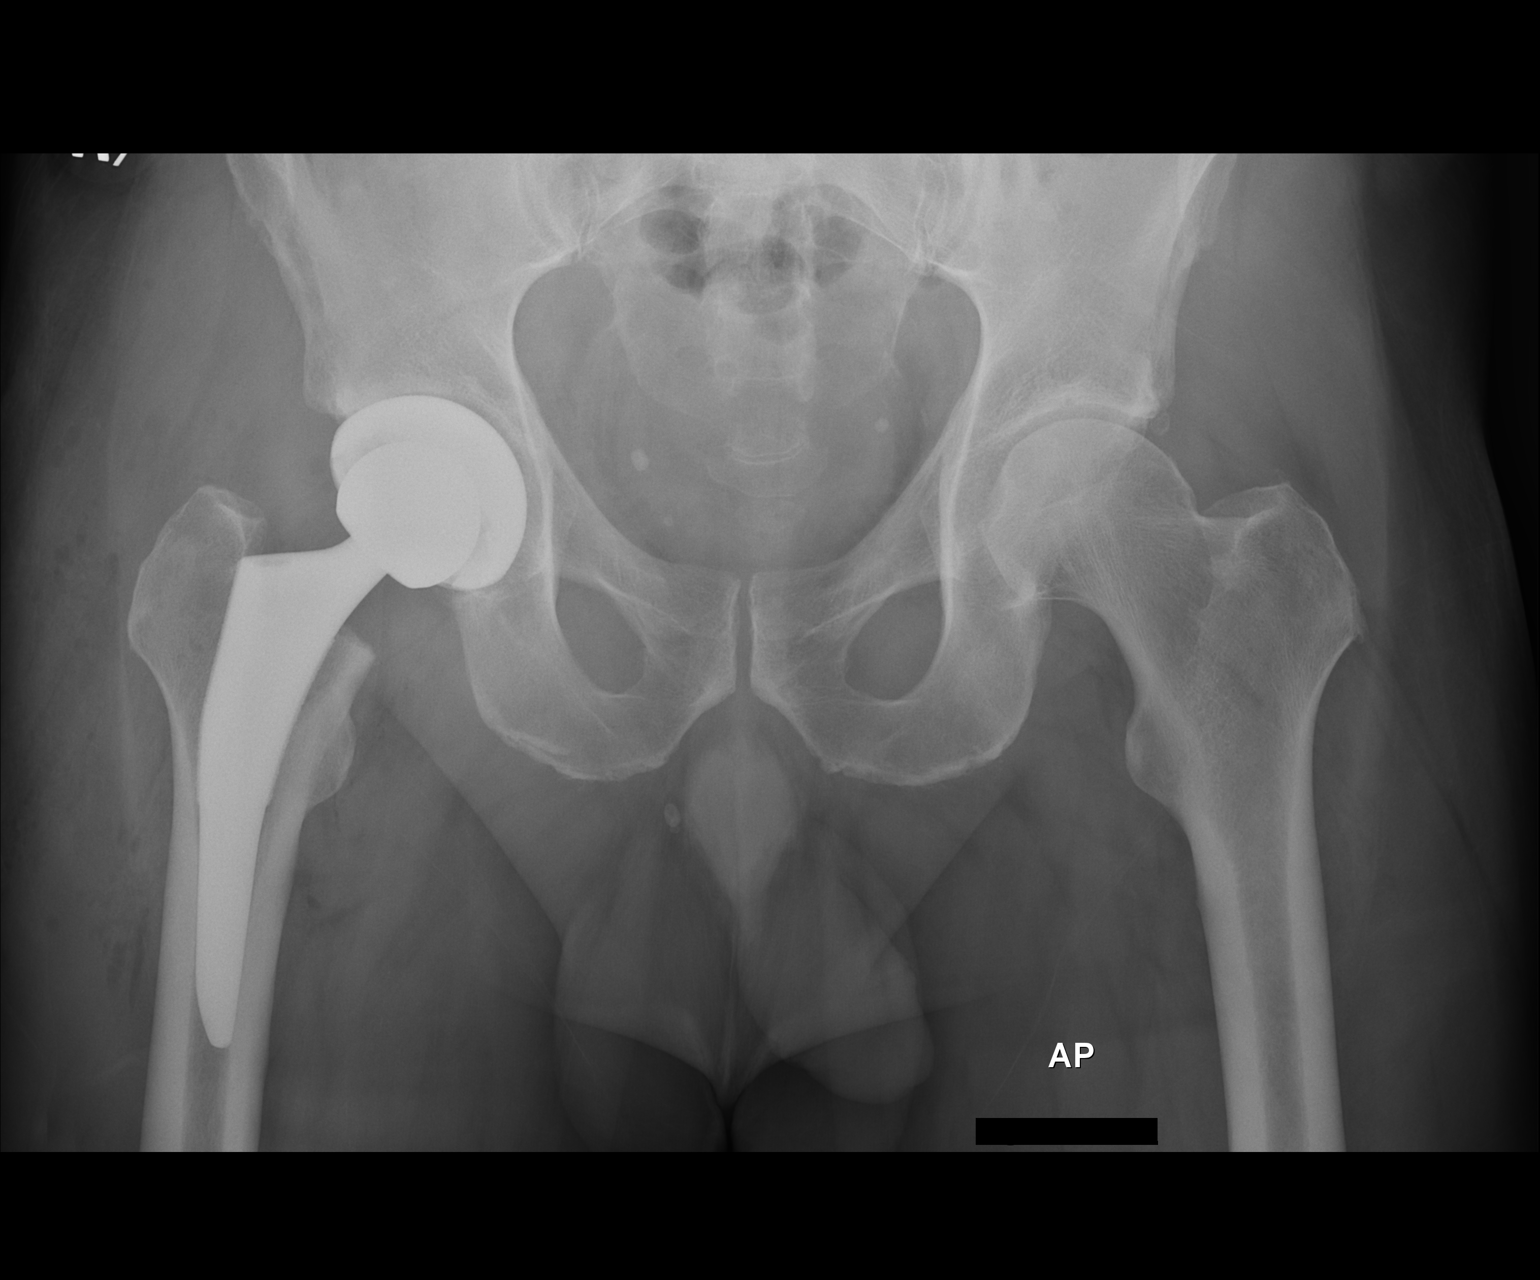

[AP (2 of 2)]
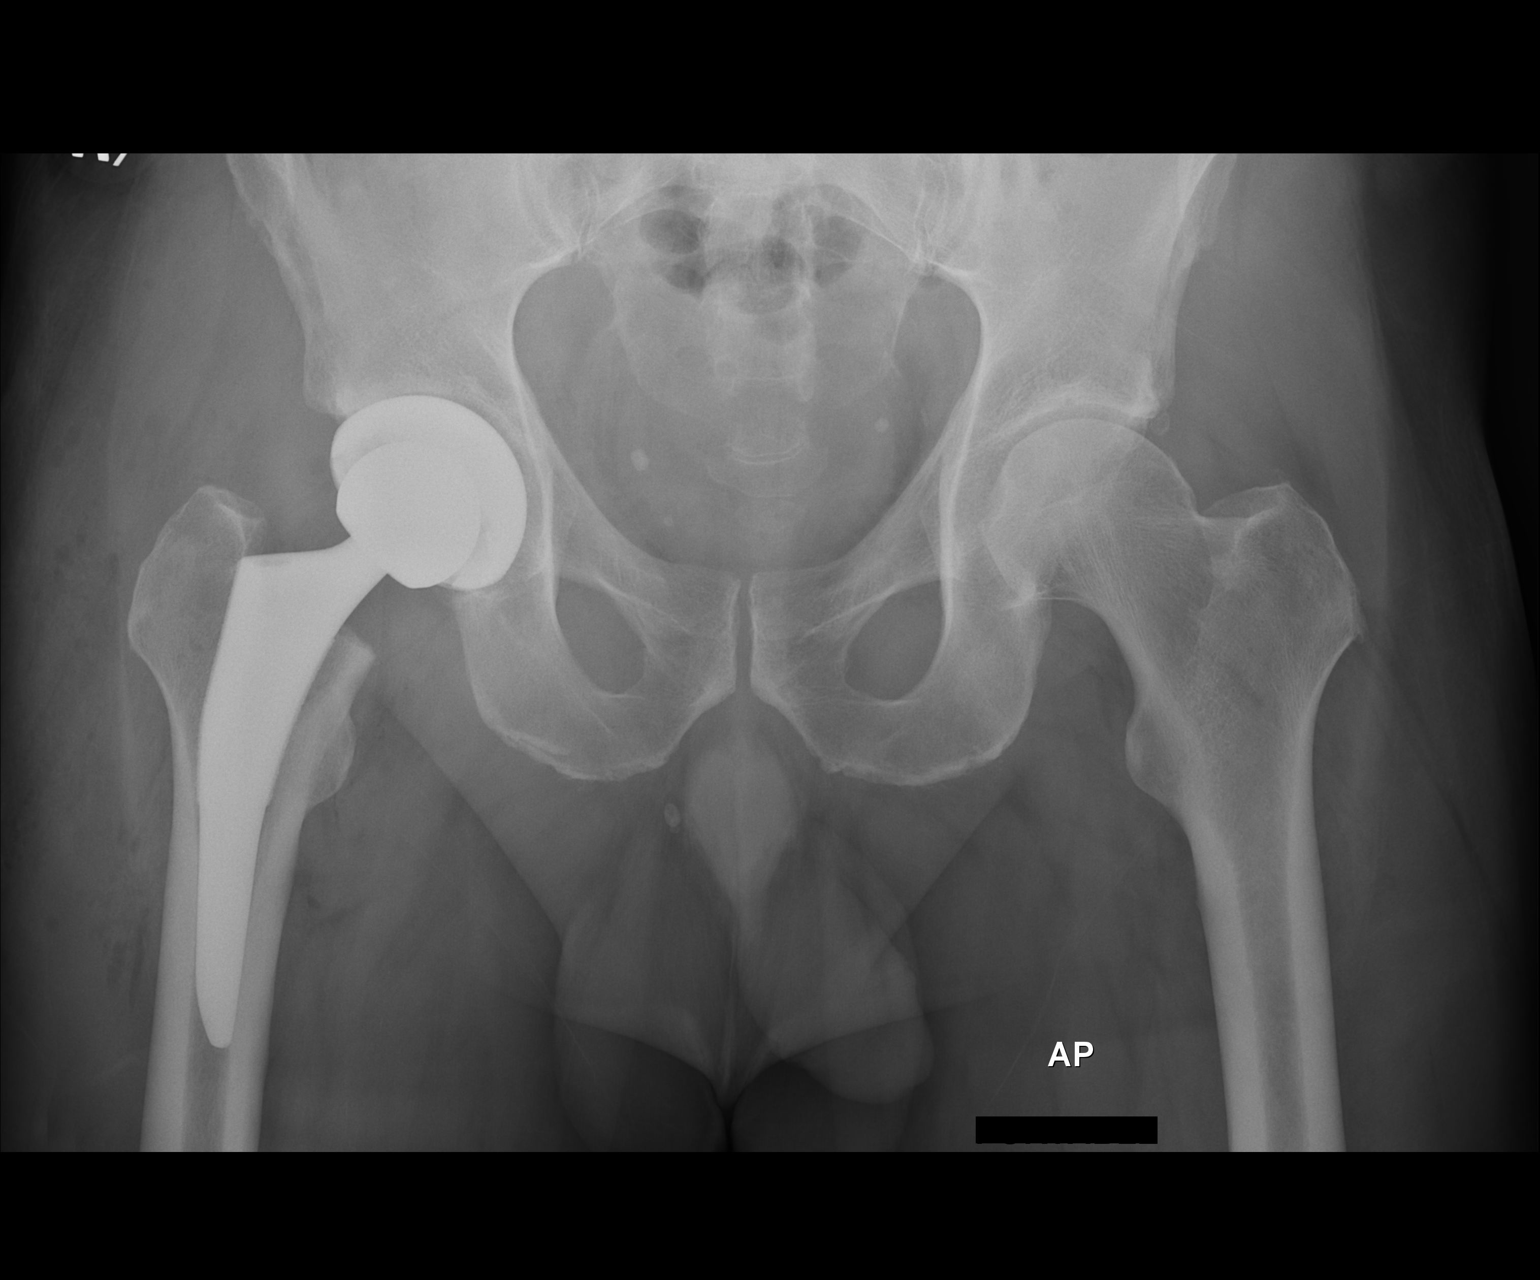

[2 of 2 positions shown; findings below may reference images not displayed]

FINDINGS: Two portable views of the pelvis submitted. There is right hip
prosthesis with anatomic alignment. Postsurgical changes are noted
small amount of periarticular soft tissue air.
IMPRESSION: Right hip prosthesis with anatomic alignment. Postsurgical changes
are noted.

## 2017-01-20 IMAGING — RF DG HIP (WITH PELVIS) OPERATIVE*R*
1 series · 2 of 2 positions shown · non-contrast
Comparison: Radiographs 11/25/2014

CLINICAL DATA: Right total hip arthroplasty.

EXAM:
OPERATIVE right HIP (WITH PELVIS IF PERFORMED) 2 VIEWS
TECHNIQUE: Fluoroscopic spot image(s) were submitted for interpretation
post-operatively.

[Series 1: run · 2 of 2 slices shown]
[im 1/2]
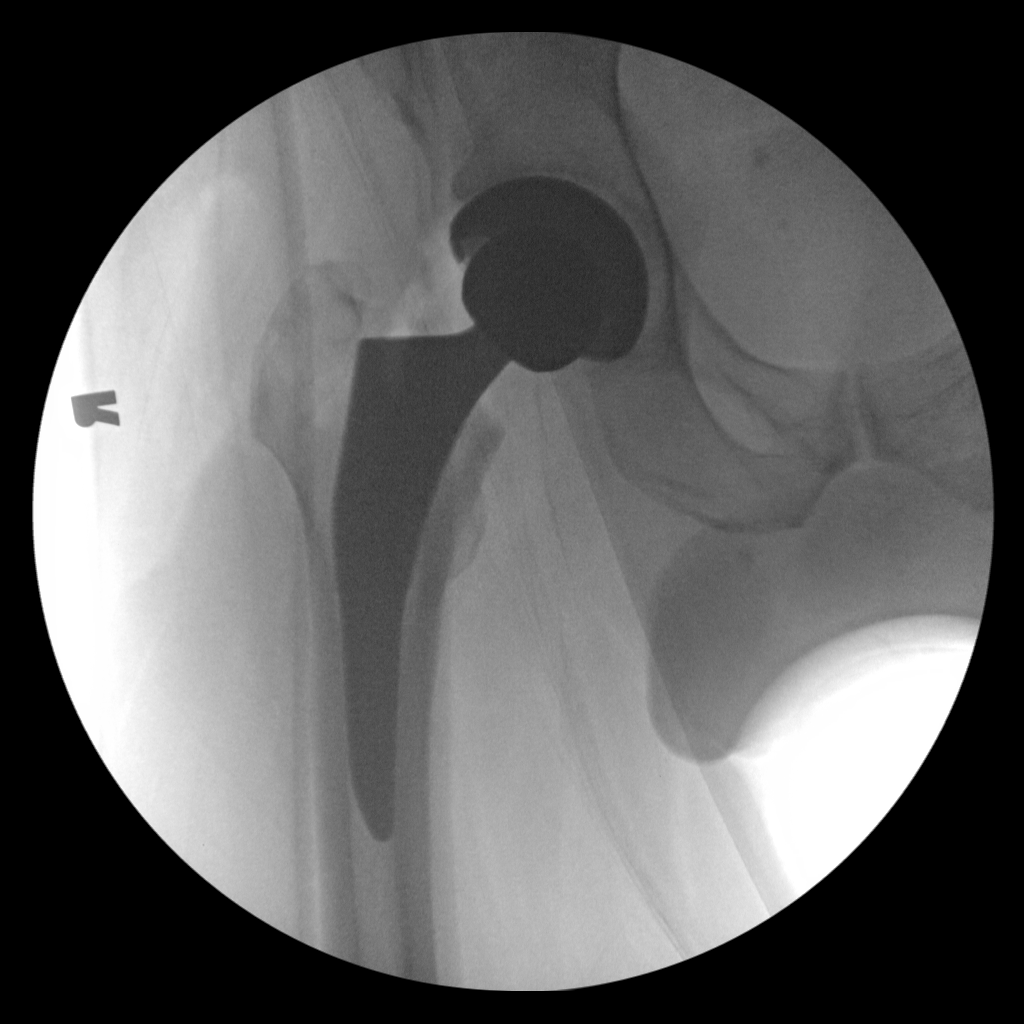
[im 2/2]
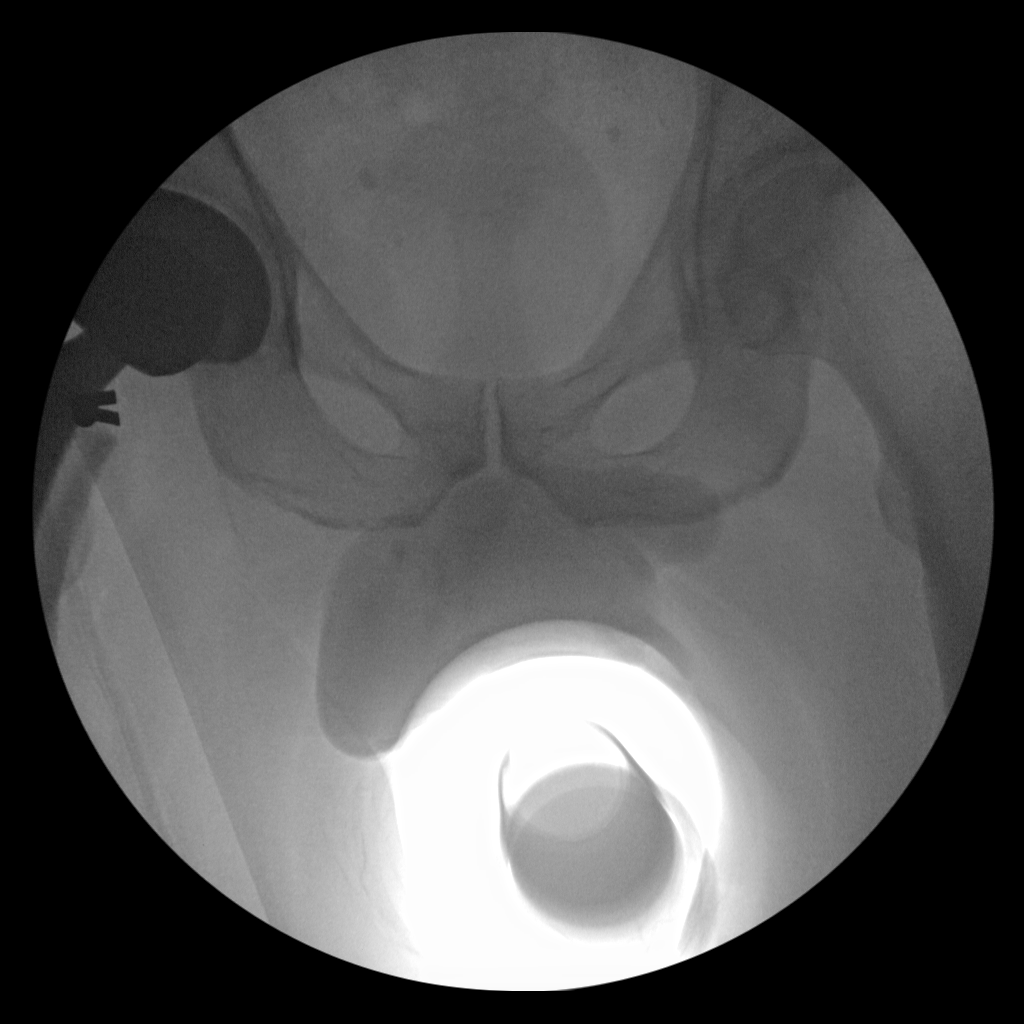

[2 of 2 positions shown; findings below may reference images not displayed]

FINDINGS: The femoral and acetabular components are well seated. No
complicating features are demonstrated. The visualized bony pelvis
is intact.
IMPRESSION: Well seated components of a total right hip arthroplasty. No
complicating features.

## 2017-05-05 ENCOUNTER — Other Ambulatory Visit: Payer: Self-pay | Admitting: Sports Medicine

## 2017-05-05 DIAGNOSIS — M1000 Idiopathic gout, unspecified site: Secondary | ICD-10-CM

## 2017-06-03 ENCOUNTER — Other Ambulatory Visit: Payer: Self-pay | Admitting: Sports Medicine

## 2017-07-06 ENCOUNTER — Encounter: Payer: Self-pay | Admitting: Sports Medicine

## 2017-07-06 ENCOUNTER — Ambulatory Visit (INDEPENDENT_AMBULATORY_CARE_PROVIDER_SITE_OTHER): Payer: BC Managed Care – PPO | Admitting: Sports Medicine

## 2017-07-06 VITALS — BP 122/73 | HR 67 | Wt 219.0 lb

## 2017-07-06 DIAGNOSIS — E291 Testicular hypofunction: Secondary | ICD-10-CM

## 2017-07-06 DIAGNOSIS — F5101 Primary insomnia: Secondary | ICD-10-CM | POA: Diagnosis not present

## 2017-07-06 DIAGNOSIS — M7989 Other specified soft tissue disorders: Secondary | ICD-10-CM | POA: Insufficient documentation

## 2017-07-06 DIAGNOSIS — E785 Hyperlipidemia, unspecified: Secondary | ICD-10-CM

## 2017-07-06 DIAGNOSIS — M1000 Idiopathic gout, unspecified site: Secondary | ICD-10-CM

## 2017-07-06 DIAGNOSIS — Z0000 Encounter for general adult medical examination without abnormal findings: Secondary | ICD-10-CM

## 2017-07-06 MED ORDER — FUROSEMIDE 20 MG PO TABS: ORAL_TABLET | ORAL | 3 refills | Status: DC

## 2017-07-06 MED ORDER — SILDENAFIL CITRATE 100 MG PO TABS: 50.0000 mg | ORAL_TABLET | ORAL | 11 refills | Status: DC | PRN

## 2017-07-06 MED ORDER — TRAZODONE HCL 50 MG PO TABS: ORAL_TABLET | ORAL | 1 refills | Status: DC

## 2017-07-06 NOTE — Assessment & Plan Note (Signed)
I did the equivalent of a routine physical today.

## 2017-07-06 NOTE — Assessment & Plan Note (Signed)
Rechecking lipids. 

## 2017-07-06 NOTE — Progress Notes (Signed)
Subjective:    CC: Physical exam, leg swelling  HPI: Bilateral leg swelling: Occurred after a good amount of travel and some long walks, over 13 miles of hiking this weekend, no chest pain, shortness of breath.  He does have a history of coronary artery disease, his most recent stress echo in April of this past year showed an ejection fraction of 47%.  He did have an episode of what sounds to be PND.  Please see below for maintenance of other medical problems.  I reviewed the past medical history, family history, social history, surgical history, and allergies today and no changes were needed.  Please see the problem list section below in epic for further details.  Past Medical History: Past Medical History:  Diagnosis Date  . Arthritis   . Coronary artery disease    Status post stenting of the mid LAD and distal right coronary with drug-eluting stents in July 2009.  He is Asymptomatic. He had a strss echo in September 2009 which was normal.  . Dyslipidemia   . Hypertension    Poorly Controlled. Exacerbated by sedentary lifestyle, diet and weight gain, and stress.  . OCD (obsessive compulsive disorder)    Past Surgical History: Past Surgical History:  Procedure Laterality Date  . ARTHROSCOPIC REPAIR ACL    . CARDIAC CATHETERIZATION     2 stents placed in 11/2007  . EYE SURGERY     lasik 16 yrs ago  . HERNIA REPAIR     AGE 40 - HERNIA REPAIR  . left rotator cuff     2013  . SURGERY FOR SLEEP APNEA     ABOUT 10 YRS AGO-? PALATOPLASTY AND T/A-SURGERY WAS DONE IN [place]  . TONSILLECTOMY     UPP  . TOTAL HIP ARTHROPLASTY Right 06/16/2015   Procedure: RIGHT TOTAL HIP ARTHROPLASTY ANTERIOR APPROACH;  Surgeon: Frederik Pear, MD;  Location: [facility];  Service: Orthopedics;  Laterality: Right;   Social History: Social History   Socioeconomic History  . Marital status: Married    Spouse name: None  . Number of children: 1  . Years of education: None  . Highest education level:  None  Social Needs  . Financial resource strain: None  . Food insecurity - worry: None  . Food insecurity - inability: None  . Transportation needs - medical: None  . Transportation needs - non-medical: None  Occupational History  . Occupation: self employed    Employer: [employer]  Tobacco Use  . Smoking status: Former Smoker    Types: Cigars    Last attempt to quit: 06/05/2008    Years since quitting: 9.0  . Smokeless tobacco: Never Used  Substance and Sexual Activity  . Alcohol use: Yes    Alcohol/week: 1.2 oz    Types: 2 Glasses of wine per week    Comment: occasionally  . Drug use: No  . Sexual activity: None  Other Topics Concern  . None  Social History Narrative  . None   Family History: Family History  Problem Relation Age of Onset  . Diabetes Father    Allergies: Allergies  Allergen Reactions  . Penicillins Anaphylaxis    Has patient had a PCN reaction causing immediate rash, facial/tongue/throat swelling, SOB or lightheadedness with hypotension: Yes Has patient had a PCN reaction causing severe rash involving mucus membranes or skin necrosis: Yes Has patient had a PCN reaction that required hospitalization No Has patient had a PCN reaction occurring within the last 10 years:  Yes If all of the above answers are "NO", then may proceed with Cephalosporin use.    Medications: See med rec.  Review of Systems: No fevers, chills, night sweats, weight loss, chest pain, or shortness of breath.   Objective:    General: Well Developed, well nourished, and in no acute distress.  Neuro: Alert and oriented x3, extra-ocular muscles intact, sensation grossly intact. Cranial nerves II through XII are intact, motor, sensory, and coordinative functions are all intact. HEENT: Normocephalic, atraumatic, pupils equal round reactive to light, neck supple, no masses, no lymphadenopathy, thyroid nonpalpable. Oropharynx, nasopharynx, external ear canals are unremarkable. Skin:  Warm and dry, no rashes noted.  Cardiac: Regular rate and rhythm, no murmurs rubs or gallops.  Respiratory: Clear to auscultation bilaterally. Not using accessory muscles, speaking in full sentences.  Abdominal: Soft, nontender, nondistended, positive bowel sounds, no masses, no organomegaly.  Musculoskeletal: Shoulder, elbow, wrist, hip, knee, ankle stable, and with full range of motion.  He does have 2+ pitting edema in the lower extremities, right worse than left with negative bilateral Homans signs.  Impression and Recommendations:    Annual physical exam I did the equivalent of a routine physical today.  Dyslipidemia Rechecking lipids.  Gout Rechecking uric acid levels.  Insomnia Hold off on Ambien for now, starting low-dose trazodone.   He is waking up with some degree of anxiety in the atypical antidepressant action of trazodone will be helpful. Certainly this could also represent paroxysmal nocturnal dyspnea so we will workup his heart as well.  Male hypogonadism Also with some erectile dysfunction. Calling in Viagra.  Swelling of lower extremity Bilateral, checking BNP, d-dimer. Refilling Lasix, he does need another echocardiogram, most recent ejection fraction from April 2018 was 47%.  ___________________________________________ Gwen Her. Dianah Field, M.D., ABFM., CAQSM. Primary Care and [place] Instructor of [place] of [facility] of Medicine

## 2017-07-06 NOTE — Assessment & Plan Note (Signed)
Bilateral, checking BNP, d-dimer. Refilling Lasix, he does need another echocardiogram, most recent ejection fraction from April 2018 was 47%.

## 2017-07-06 NOTE — Assessment & Plan Note (Signed)
Rechecking uric acid levels. 

## 2017-07-06 NOTE — Assessment & Plan Note (Signed)
Also with some erectile dysfunction. Calling in Viagra.

## 2017-07-06 NOTE — Assessment & Plan Note (Signed)
Hold off on Ambien for now, starting low-dose trazodone.   He is waking up with some degree of anxiety in the atypical antidepressant action of trazodone will be helpful. Certainly this could also represent paroxysmal nocturnal dyspnea so we will workup his heart as well.

## 2017-07-11 ENCOUNTER — Encounter: Payer: Self-pay | Admitting: Sports Medicine

## 2017-07-11 DIAGNOSIS — Z202 Contact with and (suspected) exposure to infections with a predominantly sexual mode of transmission: Secondary | ICD-10-CM

## 2017-07-12 NOTE — Telephone Encounter (Signed)
[name] would you see if we can add on the blood tests, he will still need to come in to provide the urine sample to the lab.

## 2017-07-12 NOTE — Telephone Encounter (Signed)
Labs added.

## 2017-07-16 LAB — LIPID PANEL W/REFLEX DIRECT LDL
Cholesterol: 174 mg/dL (ref <200)
HDL: 48 mg/dL (ref >40)
LDL Cholesterol (Calc): 99 mg/dL
Non-HDL Cholesterol (Calc): 126 mg/dL (calc) (ref <130)
Total CHOL/HDL Ratio: 3.6 (calc) (ref <5.0)
Triglycerides: 172 mg/dL — ABNORMAL HIGH (ref <150)

## 2017-07-16 LAB — COMPREHENSIVE METABOLIC PANEL WITH GFR
Alkaline phosphatase (APISO): 56 U/L (ref 40–115)
BUN: 15 mg/dL (ref 7–25)
CO2: 29 mmol/L (ref 20–32)
Calcium: 9.4 mg/dL (ref 8.6–10.3)
Globulin: 2.2 g/dL (ref 1.9–3.7)
Sodium: 141 mmol/L (ref 135–146)
Total Bilirubin: 0.4 mg/dL (ref 0.2–1.2)
Total Protein: 6.4 g/dL (ref 6.1–8.1)

## 2017-07-16 LAB — URIC ACID: Uric Acid, Serum: 6.1 mg/dL (ref 4.0–8.0)

## 2017-07-16 LAB — CBC
HCT: 45.3 % (ref 38.5–50.0)
Hemoglobin: 15.7 g/dL (ref 13.2–17.1)
MCH: 30.2 pg (ref 27.0–33.0)
MCHC: 34.7 g/dL (ref 32.0–36.0)
MCV: 87.1 fL (ref 80.0–100.0)
MPV: 10.1 fL (ref 7.5–12.5)
Platelets: 245 10*3/uL (ref 140–400)
RBC: 5.2 10*6/uL (ref 4.20–5.80)
RDW: 12.1 % (ref 11.0–15.0)
WBC: 7.6 Thousand/uL (ref 3.8–10.8)

## 2017-07-16 LAB — HEPATITIS PANEL, ACUTE
Hep A IgM: NONREACTIVE
Hep B C IgM: NONREACTIVE
Hepatitis B Surface Ag: NONREACTIVE
Hepatitis C Ab: NONREACTIVE
SIGNAL TO CUT-OFF: 0 (ref <1.00)

## 2017-07-16 LAB — COMPREHENSIVE METABOLIC PANEL
AG Ratio: 1.9 (calc) (ref 1.0–2.5)
ALT: 25 U/L (ref 9–46)
AST: 21 U/L (ref 10–35)
Albumin: 4.2 g/dL (ref 3.6–5.1)
Chloride: 107 mmol/L (ref 98–110)
Creat: 1.17 mg/dL (ref 0.70–1.25)
Glucose, Bld: 92 mg/dL (ref 65–99)
Potassium: 4.6 mmol/L (ref 3.5–5.3)

## 2017-07-16 LAB — HSV 1/2 AB (IGM), IFA W/RFLX TITER
HSV 1 IgM Screen: NEGATIVE
HSV 2 IgM Screen: NEGATIVE

## 2017-07-16 LAB — HIV ANTIBODY (ROUTINE TESTING W REFLEX): HIV 1&2 Ab, 4th Generation: NONREACTIVE

## 2017-07-16 LAB — BRAIN NATRIURETIC PEPTIDE: Brain Natriuretic Peptide: 15 pg/mL (ref <100)

## 2017-07-16 LAB — HEMOGLOBIN A1C
Hgb A1c MFr Bld: 5.4 % of total Hgb (ref <5.7)
Mean Plasma Glucose: 108 (calc)
eAG (mmol/L): 6 (calc)

## 2017-07-16 LAB — RPR: RPR Ser Ql: NONREACTIVE

## 2017-07-16 LAB — D-DIMER, QUANTITATIVE: D-Dimer, Quant: 0.39 mcg/mL FEU (ref <0.50)

## 2017-07-16 LAB — TSH: TSH: 2.35 m[IU]/L (ref 0.40–4.50)

## 2017-07-17 LAB — C. TRACHOMATIS/N. GONORRHOEAE RNA
C. trachomatis RNA, TMA: NOT DETECTED
N. gonorrhoeae RNA, TMA: NOT DETECTED

## 2017-07-19 LAB — HEPATITIS PANEL, ACUTE
Hep A IgM: NONREACTIVE
Hep B C IgM: NONREACTIVE
Hepatitis B Surface Ag: NONREACTIVE
Hepatitis C Ab: NONREACTIVE
SIGNAL TO CUT-OFF: 0.01 (ref <1.00)

## 2017-07-19 LAB — HIV ANTIBODY (ROUTINE TESTING W REFLEX): HIV 1&2 Ab, 4th Generation: NONREACTIVE

## 2017-07-19 LAB — RPR: RPR Ser Ql: NONREACTIVE

## 2017-07-19 LAB — HSV 1/2 AB (IGM), IFA W/RFLX TITER
HSV 1 IgM Screen: NEGATIVE
HSV 2 IgM Screen: NEGATIVE

## 2017-07-25 ENCOUNTER — Other Ambulatory Visit (HOSPITAL_BASED_OUTPATIENT_CLINIC_OR_DEPARTMENT_OTHER): Payer: Self-pay

## 2017-07-30 ENCOUNTER — Other Ambulatory Visit: Payer: Self-pay

## 2017-07-30 DIAGNOSIS — M1000 Idiopathic gout, unspecified site: Secondary | ICD-10-CM

## 2017-07-30 MED ORDER — ALLOPURINOL 100 MG PO TABS: 100.0000 mg | ORAL_TABLET | Freq: Every day | ORAL | 3 refills | Status: DC

## 2017-08-01 ENCOUNTER — Other Ambulatory Visit: Payer: Self-pay

## 2017-08-01 DIAGNOSIS — F5101 Primary insomnia: Secondary | ICD-10-CM

## 2017-08-01 MED ORDER — TRAZODONE HCL 50 MG PO TABS
ORAL_TABLET | ORAL | 0 refills | Status: DC
Start: 2017-08-01 — End: 2017-10-27

## 2017-08-10 ENCOUNTER — Ambulatory Visit (HOSPITAL_BASED_OUTPATIENT_CLINIC_OR_DEPARTMENT_OTHER)
Admission: RE | Admit: 2017-08-10 | Discharge: 2017-08-10 | Disposition: A | Discharge status: Home / Self Care | Payer: BC Managed Care – PPO | Source: Ambulatory Visit | Attending: Sports Medicine | Admitting: Sports Medicine

## 2017-08-10 DIAGNOSIS — I420 Dilated cardiomyopathy: Secondary | ICD-10-CM | POA: Diagnosis not present

## 2017-08-10 DIAGNOSIS — M7989 Other specified soft tissue disorders: Secondary | ICD-10-CM | POA: Diagnosis present

## 2017-08-10 DIAGNOSIS — I509 Heart failure, unspecified: Secondary | ICD-10-CM

## 2017-08-10 NOTE — Progress Notes (Signed)
Echocardiogram 2D Echocardiogram has been performed.  Nathan Sampson 08/10/2017, 10:25 AM

## 2017-08-30 ENCOUNTER — Encounter: Payer: Self-pay | Admitting: Sports Medicine

## 2017-08-30 ENCOUNTER — Ambulatory Visit (INDEPENDENT_AMBULATORY_CARE_PROVIDER_SITE_OTHER): Payer: BC Managed Care – PPO | Admitting: Sports Medicine

## 2017-08-30 DIAGNOSIS — M47816 Spondylosis without myelopathy or radiculopathy, lumbar region: Secondary | ICD-10-CM

## 2017-08-30 DIAGNOSIS — N419 Inflammatory disease of prostate, unspecified: Secondary | ICD-10-CM | POA: Insufficient documentation

## 2017-08-30 DIAGNOSIS — N411 Chronic prostatitis: Secondary | ICD-10-CM

## 2017-08-30 MED ORDER — METHYLPREDNISOLONE SODIUM SUCC 125 MG IJ SOLR
125.0000 mg | Freq: Once | INTRAMUSCULAR | Status: AC
  Administered 2017-08-30: 125 mg via INTRAMUSCULAR

## 2017-08-30 MED ORDER — CIPROFLOXACIN HCL 750 MG PO TABS: 750.0000 mg | ORAL_TABLET | Freq: Two times a day (BID) | ORAL | 0 refills | Status: AC

## 2017-08-30 MED ORDER — KETOROLAC TROMETHAMINE 30 MG/ML IJ SOLN
30.0000 mg | Freq: Once | INTRAMUSCULAR | Status: AC
  Administered 2017-08-30: 30 mg via INTRAMUSCULAR

## 2017-08-30 MED ORDER — PREDNISONE 50 MG PO TABS: ORAL_TABLET | ORAL | 0 refills | Status: DC

## 2017-08-30 NOTE — Progress Notes (Signed)
Subjective:    CC: Back pain  HPI: Nathan Sampson is a pleasant 62 year old male, for the past several days he has had severe pain in his left low back, worse with sitting, flexion, Valsalva, radiation to the hip and buttock but not past the thigh, nothing overtly radicular, no bowel or bladder dysfunction, saddle numbness, no constitutional symptoms.  He also has a history of chronic prostatitis, occasionally needs Cipro.  Needs a refill.  I reviewed the past medical history, family history, social history, surgical history, and allergies today and no changes were needed.  Please see the problem list section below in epic for further details.  Past Medical History: Past Medical History:  Diagnosis Date  . Arthritis   . Coronary artery disease    Status post stenting of the mid LAD and distal right coronary with drug-eluting stents in July 2009.  He is Asymptomatic. He had a strss echo in September 2009 which was normal.  . Dyslipidemia   . Hypertension    Poorly Controlled. Exacerbated by sedentary lifestyle, diet and weight gain, and stress.  . OCD (obsessive compulsive disorder)    Past Surgical History: Past Surgical History:  Procedure Laterality Date  . ARTHROSCOPIC REPAIR ACL    . CARDIAC CATHETERIZATION     2 stents placed in 11/2007  . EYE SURGERY     lasik 16 yrs ago  . HERNIA REPAIR     AGE 13 - HERNIA REPAIR  . left rotator cuff     2013  . SURGERY FOR SLEEP APNEA     ABOUT 10 YRS AGO-? PALATOPLASTY AND T/A-SURGERY WAS DONE IN [place]  . TONSILLECTOMY     UPP  . TOTAL HIP ARTHROPLASTY Right 06/16/2015   Procedure: RIGHT TOTAL HIP ARTHROPLASTY ANTERIOR APPROACH;  Surgeon: Frederik Pear, MD;  Location: [facility];  Service: Orthopedics;  Laterality: Right;   Social History: Social History   Socioeconomic History  . Marital status: Married    Spouse name: Not on file  . Number of children: 1  . Years of education: Not on file  . Highest education level: Not on file    Occupational History  . Occupation: self employed    Employer: [employer]  . Financial resource strain: Not on file  . Food insecurity:    Worry: Not on file    Inability: Not on file  . Transportation needs:    Medical: Not on file    Non-medical: Not on file  Tobacco Use  . Smoking status: Former Smoker    Types: Cigars    Last attempt to quit: 06/05/2008    Years since quitting: 9.2  . Smokeless tobacco: Never Used  Substance and Sexual Activity  . Alcohol use: Yes    Alcohol/week: 1.2 oz    Types: 2 Glasses of wine per week    Comment: occasionally  . Drug use: No  . Sexual activity: Not on file  Lifestyle  . Physical activity:    Days per week: Not on file    Minutes per session: Not on file  . Stress: Not on file  Relationships  . Social connections:    Talks on phone: Not on file    Gets together: Not on file    Attends religious service: Not on file    Active member of club or organization: Not on file    Attends meetings of clubs or organizations: Not on file    Relationship status: Not on  file  Other Topics Concern  . Not on file  Social History Narrative  . Not on file   Family History: Family History  Problem Relation Age of Onset  . Diabetes Father    Allergies: Allergies  Allergen Reactions  . Penicillins Anaphylaxis    Has patient had a PCN reaction causing immediate rash, facial/tongue/throat swelling, SOB or lightheadedness with hypotension: Yes Has patient had a PCN reaction causing severe rash involving mucus membranes or skin necrosis: Yes Has patient had a PCN reaction that required hospitalization No Has patient had a PCN reaction occurring within the last 10 years: Yes If all of the above answers are "NO", then may proceed with Cephalosporin use.    Medications: See med rec.  Review of Systems: No fevers, chills, night sweats, weight loss, chest pain, or shortness of breath.   Objective:    General: Well  Developed, well nourished, and in no acute distress.  Neuro: Alert and oriented x3, extra-ocular muscles intact, sensation grossly intact.  HEENT: Normocephalic, atraumatic, pupils equal round reactive to light, neck supple, no masses, no lymphadenopathy, thyroid nonpalpable.  Skin: Warm and dry, no rashes. Cardiac: Regular rate and rhythm, no murmurs rubs or gallops, no lower extremity edema.  Respiratory: Clear to auscultation bilaterally. Not using accessory muscles, speaking in full sentences. Back Exam:  Inspection: Unremarkable  Motion: Flexion 45 deg, Extension 45 deg, Side Bending to 45 deg bilaterally,  Rotation to 45 deg bilaterally  SLR laying: Negative  XSLR laying: Negative  Palpable tenderness: None. FABER: negative. Sensory change: Gross sensation intact to all lumbar and sacral dermatomes.  Reflexes: 2+ at both patellar tendons, 2+ at achilles tendons, Babinski's downgoing.  Strength at foot  Plantar-flexion: 5/5 Dorsi-flexion: 5/5 Eversion: 5/5 Inversion: 5/5  Leg strength  Quad: 5/5 Hamstring: 5/5 Hip flexor: 5/5 Hip abductors: 5/5  Gait unremarkable.  Impression and Recommendations:    Lumbar spondylosis Agenic pain on the left. Toradol 30, Solu-Medrol 125. Prednisone, continue methocarbamol but 3 times a day, rehab exercises given. Return to see me as needed. Pain is in the lower left back, he does have a fairly arthritic facet here at L5-S1 which can be the target for future intervention if needed.  Prostatitis Prostatitis, needs a refill on Cipro for use as needed. ___________________________________________ Gwen Her. Dianah Field, M.D., ABFM., CAQSM. Primary Care and [place] Instructor of [place] of [employer] of Medicine

## 2017-08-30 NOTE — Assessment & Plan Note (Signed)
Prostatitis, needs a refill on Cipro for use as needed.

## 2017-08-30 NOTE — Assessment & Plan Note (Signed)
Agenic pain on the left. Toradol 30, Solu-Medrol 125. Prednisone, continue methocarbamol but 3 times a day, rehab exercises given. Return to see me as needed. Pain is in the lower left back, he does have a fairly arthritic facet here at L5-S1 which can be the target for future intervention if needed.

## 2017-09-01 ENCOUNTER — Encounter: Payer: Self-pay | Admitting: Sports Medicine

## 2017-09-03 MED ORDER — HYDROCODONE-ACETAMINOPHEN 10-325 MG PO TABS: 1.0000 | ORAL_TABLET | Freq: Four times a day (QID) | ORAL | 0 refills | Status: DC | PRN

## 2017-09-30 ENCOUNTER — Other Ambulatory Visit: Payer: Self-pay | Admitting: Sports Medicine

## 2017-09-30 DIAGNOSIS — E785 Hyperlipidemia, unspecified: Secondary | ICD-10-CM

## 2017-09-30 DIAGNOSIS — I2510 Atherosclerotic heart disease of native coronary artery without angina pectoris: Secondary | ICD-10-CM

## 2017-10-27 ENCOUNTER — Other Ambulatory Visit: Payer: Self-pay | Admitting: Sports Medicine

## 2017-10-27 DIAGNOSIS — M7989 Other specified soft tissue disorders: Secondary | ICD-10-CM

## 2017-10-27 DIAGNOSIS — F5101 Primary insomnia: Secondary | ICD-10-CM

## 2017-10-27 DIAGNOSIS — M1000 Idiopathic gout, unspecified site: Secondary | ICD-10-CM

## 2017-11-18 ENCOUNTER — Other Ambulatory Visit: Payer: Self-pay | Admitting: Sports Medicine

## 2017-11-18 DIAGNOSIS — F5101 Primary insomnia: Secondary | ICD-10-CM

## 2018-01-27 ENCOUNTER — Other Ambulatory Visit: Payer: Self-pay | Admitting: Sports Medicine

## 2018-01-27 DIAGNOSIS — M1000 Idiopathic gout, unspecified site: Secondary | ICD-10-CM

## 2018-01-28 ENCOUNTER — Other Ambulatory Visit: Payer: Self-pay | Admitting: Sports Medicine

## 2018-01-28 DIAGNOSIS — M7989 Other specified soft tissue disorders: Secondary | ICD-10-CM

## 2018-03-23 ENCOUNTER — Encounter: Payer: Self-pay | Admitting: Sports Medicine

## 2018-03-27 MED ORDER — TADALAFIL 5 MG PO TABS: 5.0000 mg | ORAL_TABLET | Freq: Every day | ORAL | 11 refills | Status: DC

## 2018-05-01 ENCOUNTER — Other Ambulatory Visit: Payer: Self-pay | Admitting: Sports Medicine

## 2018-05-01 DIAGNOSIS — M1000 Idiopathic gout, unspecified site: Secondary | ICD-10-CM

## 2018-06-10 DIAGNOSIS — L570 Actinic keratosis: Secondary | ICD-10-CM | POA: Diagnosis not present

## 2018-06-10 DIAGNOSIS — D229 Melanocytic nevi, unspecified: Secondary | ICD-10-CM | POA: Diagnosis not present

## 2018-06-10 DIAGNOSIS — D492 Neoplasm of unspecified behavior of bone, soft tissue, and skin: Secondary | ICD-10-CM | POA: Diagnosis not present

## 2018-06-10 DIAGNOSIS — L821 Other seborrheic keratosis: Secondary | ICD-10-CM | POA: Diagnosis not present

## 2018-06-10 DIAGNOSIS — C44311 Basal cell carcinoma of skin of nose: Secondary | ICD-10-CM | POA: Diagnosis not present

## 2018-06-10 DIAGNOSIS — Z1283 Encounter for screening for malignant neoplasm of skin: Secondary | ICD-10-CM | POA: Diagnosis not present

## 2018-07-01 DIAGNOSIS — C44311 Basal cell carcinoma of skin of nose: Secondary | ICD-10-CM | POA: Diagnosis not present

## 2018-07-02 ENCOUNTER — Encounter: Payer: Self-pay | Admitting: Sports Medicine

## 2018-07-05 DIAGNOSIS — Z5189 Encounter for other specified aftercare: Secondary | ICD-10-CM | POA: Diagnosis not present

## 2018-07-09 ENCOUNTER — Encounter: Payer: Self-pay | Admitting: Sports Medicine

## 2018-07-09 ENCOUNTER — Ambulatory Visit (INDEPENDENT_AMBULATORY_CARE_PROVIDER_SITE_OTHER): Payer: BC Managed Care – PPO | Admitting: Sports Medicine

## 2018-07-09 DIAGNOSIS — C44311 Basal cell carcinoma of skin of nose: Secondary | ICD-10-CM | POA: Diagnosis not present

## 2018-07-09 DIAGNOSIS — Z0000 Encounter for general adult medical examination without abnormal findings: Secondary | ICD-10-CM | POA: Diagnosis not present

## 2018-07-09 DIAGNOSIS — R05 Cough: Secondary | ICD-10-CM

## 2018-07-09 DIAGNOSIS — R059 Cough, unspecified: Secondary | ICD-10-CM | POA: Insufficient documentation

## 2018-07-09 MED ORDER — BENZONATATE 200 MG PO CAPS: 200.0000 mg | ORAL_CAPSULE | Freq: Three times a day (TID) | ORAL | 0 refills | Status: DC | PRN

## 2018-07-09 MED ORDER — ALBUTEROL SULFATE HFA 108 (90 BASE) MCG/ACT IN AERS: 2.0000 | INHALATION_SPRAY | RESPIRATORY_TRACT | 11 refills | Status: AC | PRN

## 2018-07-09 NOTE — Assessment & Plan Note (Signed)
Due for multiple routine labs.

## 2018-07-09 NOTE — Progress Notes (Signed)
Subjective:    CC: Several issues  HPI: Basal cell carcinoma: Had this removed with Mohs surgery as well as a transposition flap in [place].  He is here to have sutures removed.  Things are healing well.  Coughing: Generally occurs on long flights, typically in the airplane he will take some Tussionex and this calms his cough.  No shortness of breath, does not smoke, only occurs with dry air.  He is wondering if we can refill his Tussionex.  No fevers, chills.  Preventive measures: Due for some routine labs and a physical.  I reviewed the past medical history, family history, social history, surgical history, and allergies today and no changes were needed.  Please see the problem list section below in epic for further details.  Past Medical History: Past Medical History:  Diagnosis Date  . Arthritis   . Coronary artery disease    Status post stenting of the mid LAD and distal right coronary with drug-eluting stents in July 2009.  He is Asymptomatic. He had a strss echo in September 2009 which was normal.  . Dyslipidemia   . Hypertension    Poorly Controlled. Exacerbated by sedentary lifestyle, diet and weight gain, and stress.  . OCD (obsessive compulsive disorder)    Past Surgical History: Past Surgical History:  Procedure Laterality Date  . ARTHROSCOPIC REPAIR ACL    . CARDIAC CATHETERIZATION     2 stents placed in 11/2007  . EYE SURGERY     lasik 16 yrs ago  . HERNIA REPAIR     AGE 77 - HERNIA REPAIR  . left rotator cuff     2013  . SURGERY FOR SLEEP APNEA     ABOUT 10 YRS AGO-? PALATOPLASTY AND T/A-SURGERY WAS DONE IN [place]  . TONSILLECTOMY     UPP  . TOTAL HIP ARTHROPLASTY Right 06/16/2015   Procedure: RIGHT TOTAL HIP ARTHROPLASTY ANTERIOR APPROACH;  Surgeon: Frederik Pear, MD;  Location: [facility];  Service: Orthopedics;  Laterality: Right;   Social History: Social History   Socioeconomic History  . Marital status: Married    Spouse name: Not on file  .  Number of children: 1  . Years of education: Not on file  . Highest education level: Not on file  Occupational History  . Occupation: self employed    Employer: [employer]  . Financial resource strain: Not on file  . Food insecurity:    Worry: Not on file    Inability: Not on file  . Transportation needs:    Medical: Not on file    Non-medical: Not on file  Tobacco Use  . Smoking status: Former Smoker    Types: Cigars    Last attempt to quit: 06/05/2008    Years since quitting: 10.0  . Smokeless tobacco: Never Used  Substance and Sexual Activity  . Alcohol use: Yes    Alcohol/week: 2.0 standard drinks    Types: 2 Glasses of wine per week    Comment: occasionally  . Drug use: No  . Sexual activity: Not on file  Lifestyle  . Physical activity:    Days per week: Not on file    Minutes per session: Not on file  . Stress: Not on file  Relationships  . Social connections:    Talks on phone: Not on file    Gets together: Not on file    Attends religious service: Not on file    Active member of club or  organization: Not on file    Attends meetings of clubs or organizations: Not on file    Relationship status: Not on file  Other Topics Concern  . Not on file  Social History Narrative  . Not on file   Family History: Family History  Problem Relation Age of Onset  . Diabetes Father    Allergies: Allergies  Allergen Reactions  . Penicillins Anaphylaxis    Has patient had a PCN reaction causing immediate rash, facial/tongue/throat swelling, SOB or lightheadedness with hypotension: Yes Has patient had a PCN reaction causing severe rash involving mucus membranes or skin necrosis: Yes Has patient had a PCN reaction that required hospitalization No Has patient had a PCN reaction occurring within the last 10 years: Yes If all of the above answers are "NO", then may proceed with Cephalosporin use.    Medications: See med rec.  Review of Systems: No fevers,  chills, night sweats, weight loss, chest pain, or shortness of breath.   Objective:    General: Well Developed, well nourished, and in no acute distress.  Neuro: Alert and oriented x3, extra-ocular muscles intact, sensation grossly intact.  HEENT: Normocephalic, atraumatic, pupils equal round reactive to light, neck supple, no masses, no lymphadenopathy, thyroid nonpalpable.  Skin: Warm and dry, no rashes.  Removed several stitches from a well-healed transposition flap on his left nose. Cardiac: Regular rate and rhythm, no murmurs rubs or gallops, no lower extremity edema.  Respiratory: Clear to auscultation bilaterally. Not using accessory muscles, speaking in full sentences.  Impression and Recommendations:    Basal cell carcinoma (BCC) of left side of nose Multiple sutures removed today. Anticipatory guidance given.   Annual physical exam Due for multiple routine labs.  Coughing Patient is requesting Tussionex. I advised him that we would not be doing refills on a narcotic, adding Tessalon Perles. Adding albuterol as well to use before flights. If cough persists for another few weeks we should further evaluate, he does have a history of some lingular scarring, cough occurs primarily on long flights. If persists we will evaluate him for reactive airways/COPD.  I spent 40 minutes with this patient, greater than 50% was face-to-face time counseling regarding the above diagnoses, specifically anticipatory guidance regarding his basal cell carcinoma, differential diagnosis and treatment options as well as diagnostic techniques for his coughing, he also spent a great deal of time showing me a virtual reality headset that he had obtained. ___________________________________________ Gwen Her. Dianah Field, M.D., ABFM., CAQSM. Primary Care and Sports Medicine [facility]  Adjunct Professor of [place] [employer] [facility] [employer]

## 2018-07-09 NOTE — Assessment & Plan Note (Signed)
Multiple sutures removed today. Anticipatory guidance given.

## 2018-07-09 NOTE — Assessment & Plan Note (Addendum)
Patient is requesting Tussionex. I advised him that we would not be doing refills on a narcotic, adding Tessalon Perles. Adding albuterol as well to use before flights. If cough persists for another few weeks we should further evaluate, he does have a history of some lingular scarring, cough occurs primarily on long flights. If persists we will evaluate him for reactive airways/COPD.

## 2018-07-10 LAB — COMPREHENSIVE METABOLIC PANEL
ALT: 23 U/L (ref 9–46)
Albumin: 4.3 g/dL (ref 3.6–5.1)
Alkaline phosphatase (APISO): 53 U/L (ref 35–144)
BUN: 21 mg/dL (ref 7–25)
CO2: 26 mmol/L (ref 20–32)
Calcium: 9.5 mg/dL (ref 8.6–10.3)
Creat: 1.23 mg/dL (ref 0.70–1.25)
Total Protein: 6.6 g/dL (ref 6.1–8.1)

## 2018-07-10 LAB — COMPREHENSIVE METABOLIC PANEL WITH GFR
AG Ratio: 1.9 (calc) (ref 1.0–2.5)
AST: 20 U/L (ref 10–35)
Chloride: 107 mmol/L (ref 98–110)
Globulin: 2.3 g/dL (ref 1.9–3.7)
Glucose, Bld: 111 mg/dL — ABNORMAL HIGH (ref 65–99)
Potassium: 4.5 mmol/L (ref 3.5–5.3)
Sodium: 142 mmol/L (ref 135–146)
Total Bilirubin: 0.7 mg/dL (ref 0.2–1.2)

## 2018-07-10 LAB — CBC
HCT: 44.5 % (ref 38.5–50.0)
Hemoglobin: 15.1 g/dL (ref 13.2–17.1)
MCH: 29.6 pg (ref 27.0–33.0)
MCHC: 33.9 g/dL (ref 32.0–36.0)
MCV: 87.3 fL (ref 80.0–100.0)
MPV: 9.9 fL (ref 7.5–12.5)
Platelets: 244 Thousand/uL (ref 140–400)
RBC: 5.1 10*6/uL (ref 4.20–5.80)
RDW: 12.3 % (ref 11.0–15.0)
WBC: 6.6 Thousand/uL (ref 3.8–10.8)

## 2018-07-10 LAB — LIPID PANEL W/REFLEX DIRECT LDL
Cholesterol: 187 mg/dL (ref <200)
HDL: 43 mg/dL (ref >=40)
LDL Cholesterol (Calc): 118 mg/dL — ABNORMAL HIGH
Non-HDL Cholesterol (Calc): 144 mg/dL — ABNORMAL HIGH (ref <130)
Total CHOL/HDL Ratio: 4.3 (calc) (ref <5.0)
Triglycerides: 150 mg/dL — ABNORMAL HIGH (ref <150)

## 2018-07-10 LAB — TSH: TSH: 1.14 m[IU]/L (ref 0.40–4.50)

## 2018-07-11 ENCOUNTER — Encounter: Payer: Self-pay | Admitting: Sports Medicine

## 2018-07-11 DIAGNOSIS — I2510 Atherosclerotic heart disease of native coronary artery without angina pectoris: Secondary | ICD-10-CM

## 2018-07-14 MED ORDER — NEBIVOLOL HCL 5 MG PO TABS: 5.0000 mg | ORAL_TABLET | Freq: Every day | ORAL | 3 refills | Status: DC

## 2018-07-15 ENCOUNTER — Telehealth: Payer: Self-pay | Admitting: Sports Medicine

## 2018-07-15 ENCOUNTER — Encounter: Payer: Self-pay | Admitting: Sports Medicine

## 2018-07-15 ENCOUNTER — Ambulatory Visit (INDEPENDENT_AMBULATORY_CARE_PROVIDER_SITE_OTHER): Payer: BC Managed Care – PPO | Admitting: Sports Medicine

## 2018-07-15 VITALS — BP 134/82 | HR 61 | Resp 16 | Ht 70.0 in | Wt 230.0 lb

## 2018-07-15 DIAGNOSIS — E785 Hyperlipidemia, unspecified: Secondary | ICD-10-CM | POA: Diagnosis not present

## 2018-07-15 DIAGNOSIS — K625 Hemorrhage of anus and rectum: Secondary | ICD-10-CM | POA: Insufficient documentation

## 2018-07-15 DIAGNOSIS — E6609 Other obesity due to excess calories: Secondary | ICD-10-CM

## 2018-07-15 DIAGNOSIS — Z96641 Presence of right artificial hip joint: Secondary | ICD-10-CM | POA: Diagnosis not present

## 2018-07-15 DIAGNOSIS — Z0000 Encounter for general adult medical examination without abnormal findings: Secondary | ICD-10-CM | POA: Diagnosis not present

## 2018-07-15 MED ORDER — NALTREXONE-BUPROPION HCL ER 8-90 MG PO TB12: 2.0000 | ORAL_TABLET | Freq: Two times a day (BID) | ORAL | 2 refills | Status: DC

## 2018-07-15 MED ORDER — PITAVASTATIN CALCIUM 4 MG PO TABS: 1.0000 | ORAL_TABLET | Freq: Every day | ORAL | 3 refills | Status: DC

## 2018-07-15 NOTE — Assessment & Plan Note (Signed)
Refilling Contrave.

## 2018-07-15 NOTE — Assessment & Plan Note (Signed)
Repeating right hip x-rays for surveillance of his arthroplasty.

## 2018-07-15 NOTE — Assessment & Plan Note (Signed)
Annual physical as above. Labs look okay.

## 2018-07-15 NOTE — Progress Notes (Signed)
Subjective:    CC: Annual physical and several issues  HPI:  History of total hip arthroplasty, mild hip aches and pains.  Hyperlipidemia: Having some myalgias from Lipitor.  Had the same with simvastatin in the past.  Rectal bleeding: Bright red, intermittent.  No pain, no itching.  Colonoscopy 2 years ago.  Obesity: Would like to restart Contrave.  I reviewed the past medical history, family history, social history, surgical history, and allergies today and no changes were needed.  Please see the problem list section below in epic for further details.  Past Medical History: Past Medical History:  Diagnosis Date  . Arthritis   . Coronary artery disease    Status post stenting of the mid LAD and distal right coronary with drug-eluting stents in July 2009.  He is Asymptomatic. He had a strss echo in September 2009 which was normal.  . Dyslipidemia   . Hypertension    Poorly Controlled. Exacerbated by sedentary lifestyle, diet and weight gain, and stress.  . OCD (obsessive compulsive disorder)    Past Surgical History: Past Surgical History:  Procedure Laterality Date  . ARTHROSCOPIC REPAIR ACL    . CARDIAC CATHETERIZATION     2 stents placed in 11/2007  . EYE SURGERY     lasik 16 yrs ago  . HERNIA REPAIR     AGE 63 - HERNIA REPAIR  . left rotator cuff     2013  . SURGERY FOR SLEEP APNEA     ABOUT 10 YRS AGO-? PALATOPLASTY AND T/A-SURGERY WAS DONE IN [place]  . TONSILLECTOMY     UPP  . TOTAL HIP ARTHROPLASTY Right 06/16/2015   Procedure: RIGHT TOTAL HIP ARTHROPLASTY ANTERIOR APPROACH;  Surgeon: Frederik Pear, MD;  Location: [facility];  Service: Orthopedics;  Laterality: Right;   Social History: Social History   Socioeconomic History  . Marital status: Married    Spouse name: Not on file  . Number of children: 1  . Years of education: Not on file  . Highest education level: Not on file  Occupational History  . Occupation: self employed    Employer: [employer]  . Financial resource strain: Not on file  . Food insecurity:    Worry: Not on file    Inability: Not on file  . Transportation needs:    Medical: Not on file    Non-medical: Not on file  Tobacco Use  . Smoking status: Former Smoker    Types: Cigars    Last attempt to quit: 06/05/2008    Years since quitting: 10.1  . Smokeless tobacco: Never Used  Substance and Sexual Activity  . Alcohol use: Yes    Alcohol/week: 2.0 standard drinks    Types: 2 Glasses of wine per week    Comment: occasionally  . Drug use: No  . Sexual activity: Not on file  Lifestyle  . Physical activity:    Days per week: Not on file    Minutes per session: Not on file  . Stress: Not on file  Relationships  . Social connections:    Talks on phone: Not on file    Gets together: Not on file    Attends religious service: Not on file    Active member of club or organization: Not on file    Attends meetings of clubs or organizations: Not on file    Relationship status: Not on file  Other Topics Concern  . Not on file  Social  History Narrative  . Not on file   Family History: Family History  Problem Relation Age of Onset  . Diabetes Father    Allergies: Allergies  Allergen Reactions  . Penicillins Anaphylaxis    Has patient had a PCN reaction causing immediate rash, facial/tongue/throat swelling, SOB or lightheadedness with hypotension: Yes Has patient had a PCN reaction causing severe rash involving mucus membranes or skin necrosis: Yes Has patient had a PCN reaction that required hospitalization No Has patient had a PCN reaction occurring within the last 10 years: Yes If all of the above answers are "NO", then may proceed with Cephalosporin use.    Medications: See med rec.  Review of Systems: No headache, visual changes, nausea, vomiting, diarrhea, constipation, dizziness, abdominal pain, skin rash, fevers, chills, night sweats, swollen lymph nodes, weight loss, chest pain, body  aches, joint swelling, muscle aches, shortness of breath, mood changes, visual or auditory hallucinations.  Objective:    General: Well Developed, well nourished, and in no acute distress.  Neuro: Alert and oriented x3, extra-ocular muscles intact, sensation grossly intact. Cranial nerves II through XII are intact, motor, sensory, and coordinative functions are all intact. HEENT: Normocephalic, atraumatic, pupils equal round reactive to light, neck supple, no masses, no lymphadenopathy, thyroid nonpalpable. Oropharynx, nasopharynx, external ear canals are unremarkable. Skin: Warm and dry, no rashes noted.  Cardiac: Regular rate and rhythm, no murmurs rubs or gallops.  Respiratory: Clear to auscultation bilaterally. Not using accessory muscles, speaking in full sentences.  Abdominal: Soft, nontender, nondistended, positive bowel sounds, no masses, no organomegaly.  Musculoskeletal: Shoulder, elbow, wrist, hip, knee, ankle stable, and with full range of motion.  Impression and Recommendations:    The patient was counselled, risk factors were discussed, anticipatory guidance given.  Annual physical exam Annual physical as above. Labs look okay.  History of total right hip arthroplasty Repeating right hip x-rays for surveillance of his arthroplasty.  Dyslipidemia Myalgias on statins, switching to Livalo.  Obesity Refilling Contrave.  Bright red blood per rectum Suspect hemorrhoids. He will do stool softeners, witch hazel/Tucks pads. If persistent symptoms over the next month he will return for an anoscopy. ___________________________________________ Gwen Her. Dianah Field, M.D., ABFM., CAQSM. Primary Care and Sports Medicine [facility]  Adjunct Professor of [place] [employer] [facility] [employer] Medicine

## 2018-07-15 NOTE — Assessment & Plan Note (Signed)
Suspect hemorrhoids. He will do stool softeners, witch hazel/Tucks pads. If persistent symptoms over the next month he will return for an anoscopy.

## 2018-07-15 NOTE — Assessment & Plan Note (Signed)
Myalgias on statins, switching to Livalo.

## 2018-07-15 NOTE — Telephone Encounter (Signed)
Received fax from Covermymeds that Bystolic requires a PA. Information has been sent to the insurance company. Awaiting determination.

## 2018-07-16 NOTE — Telephone Encounter (Signed)
Bystolic approved-effective from 07/15/2018 through 07/13/2021. Pharmacy aware.

## 2018-08-07 ENCOUNTER — Other Ambulatory Visit: Payer: Self-pay | Admitting: Sports Medicine

## 2018-08-07 DIAGNOSIS — M7989 Other specified soft tissue disorders: Secondary | ICD-10-CM

## 2018-08-19 ENCOUNTER — Encounter: Payer: Self-pay | Admitting: Sports Medicine

## 2018-09-08 ENCOUNTER — Encounter: Payer: Self-pay | Admitting: Sports Medicine

## 2018-09-16 ENCOUNTER — Telehealth: Payer: Self-pay | Admitting: Sports Medicine

## 2018-09-16 DIAGNOSIS — E6609 Other obesity due to excess calories: Secondary | ICD-10-CM

## 2018-09-16 NOTE — Telephone Encounter (Signed)
Received fax from Covermymeds that Contrave requires a PA. Information has been sent to the insurance company. Awaiting determination.

## 2018-09-23 MED ORDER — NALTREXONE-BUPROPION HCL ER 8-90 MG PO TB12: 2.0000 | ORAL_TABLET | Freq: Two times a day (BID) | ORAL | 2 refills | Status: AC

## 2018-09-23 NOTE — Telephone Encounter (Signed)
Done

## 2018-09-23 NOTE — Telephone Encounter (Signed)
Can you send a new prescription to the pharmacy?  Please advise.   Received fax from [facility] that [place] was approved from 09/16/2018 through 03/14/2019. Pharmacy notified and form sent to scan.   Reference ID: [id number]

## 2018-10-05 ENCOUNTER — Other Ambulatory Visit: Payer: Self-pay | Admitting: Sports Medicine

## 2018-10-08 ENCOUNTER — Other Ambulatory Visit: Payer: Self-pay | Admitting: Sports Medicine

## 2018-10-08 DIAGNOSIS — I2510 Atherosclerotic heart disease of native coronary artery without angina pectoris: Secondary | ICD-10-CM

## 2018-11-05 ENCOUNTER — Other Ambulatory Visit: Payer: Self-pay | Admitting: Sports Medicine

## 2018-11-05 DIAGNOSIS — E785 Hyperlipidemia, unspecified: Secondary | ICD-10-CM

## 2018-12-20 ENCOUNTER — Telehealth: Payer: BC Managed Care – PPO | Admitting: Family

## 2018-12-20 ENCOUNTER — Encounter: Payer: Self-pay | Admitting: Sports Medicine

## 2018-12-20 DIAGNOSIS — Z20828 Contact with and (suspected) exposure to other viral communicable diseases: Secondary | ICD-10-CM

## 2018-12-20 DIAGNOSIS — R0600 Dyspnea, unspecified: Secondary | ICD-10-CM

## 2018-12-20 DIAGNOSIS — Z20822 Contact with and (suspected) exposure to covid-19: Secondary | ICD-10-CM | POA: Insufficient documentation

## 2018-12-20 DIAGNOSIS — B349 Viral infection, unspecified: Secondary | ICD-10-CM

## 2018-12-20 DIAGNOSIS — M1000 Idiopathic gout, unspecified site: Secondary | ICD-10-CM

## 2018-12-20 MED ORDER — PROMETHAZINE-DM 6.25-15 MG/5ML PO SYRP: 5.0000 mL | ORAL_SOLUTION | Freq: Four times a day (QID) | ORAL | 0 refills | Status: DC | PRN

## 2018-12-20 NOTE — Progress Notes (Signed)
E-Visit for [employer] Virus Screening  Based on what you shared with me, I feel your condition warrants further evaluation and I recommend that you be seen for a face to face office visit.  NOTE: If you entered your credit card information for this eVisit, you will not be charged. You may see a "hold" on your card for the $35 but that hold will drop off and you will not have a charge processed.  If you are having a true medical emergency please call 911.     For an urgent face to face visit, [facility] has five urgent care centers for your convenience:    [email or web address] to reserve your spot online an avoid wait times  [facility] [address] [place], [place] [address] Modified hours of operation: Monday-Friday, 12 PM to 6 PM  Closed Saturday & Sunday  *Across the street from [place] (New Address!) [address] [place], [place] [address] *Just off [address], across the road from [place] hours of operation: Monday-Friday, 12 PM to 6 PM  Closed Saturday & Sunday   The following sites will take your insurance:  . [facility]    [phone]                  Get Driving Directions  [address], [place] [address] . 10 am to 8 pm Monday-Friday . 12 pm to 8 pm Saturday-Sunday   . [facility] at [place]                  Get Driving Directions  [address], [place], [place] [address] . 8 am to 8 pm Monday-Friday . 9 am to 6 pm Saturday . 11 am to 6 pm Sunday   . [facility] at [place]                  Get Driving Directions   [address] [place], [place] [address] . 8 am to 8 pm Monday-Friday . 8 am to 4 pm Saturday-Sunday    . [facility] at [place]                    Get Driving Directions  [phone]  [address] [place], [place] [address]  . Monday-Friday, 12 PM to 6 PM    Your e-visit answers were reviewed by a board certified advanced clinical practitioner to complete your personal care plan.  Thank you for using e-Visits.

## 2018-12-20 NOTE — Assessment & Plan Note (Signed)
Ordering NP swab.

## 2018-12-23 ENCOUNTER — Other Ambulatory Visit: Payer: Self-pay

## 2018-12-23 DIAGNOSIS — Z20822 Contact with and (suspected) exposure to covid-19: Secondary | ICD-10-CM

## 2018-12-23 DIAGNOSIS — Z20828 Contact with and (suspected) exposure to other viral communicable diseases: Secondary | ICD-10-CM | POA: Diagnosis not present

## 2018-12-26 LAB — NOVEL CORONAVIRUS, NAA: SARS-CoV-2, NAA: NOT DETECTED

## 2018-12-31 NOTE — Addendum Note (Signed)
Addended by: Silverio Decamp on: 12/31/2018 12:54 PM   Modules accepted: Orders

## 2019-01-02 DIAGNOSIS — B349 Viral infection, unspecified: Secondary | ICD-10-CM | POA: Diagnosis not present

## 2019-01-02 DIAGNOSIS — Z20828 Contact with and (suspected) exposure to other viral communicable diseases: Secondary | ICD-10-CM | POA: Diagnosis not present

## 2019-01-03 LAB — SAR COV2 SEROLOGY (COVID19)AB(IGG),IA: SARS CoV2 AB IGG: NEGATIVE

## 2019-01-03 NOTE — Telephone Encounter (Signed)
No antibodies to COVID detected.

## 2019-01-06 MED ORDER — ALLOPURINOL 100 MG PO TABS: 100.0000 mg | ORAL_TABLET | Freq: Every day | ORAL | 0 refills | Status: DC

## 2019-01-06 NOTE — Addendum Note (Signed)
Addended by: Silverio Decamp on: 01/06/2019 12:40 PM   Modules accepted: Orders

## 2019-03-05 ENCOUNTER — Other Ambulatory Visit: Payer: Self-pay | Admitting: Sports Medicine

## 2019-03-05 DIAGNOSIS — M7989 Other specified soft tissue disorders: Secondary | ICD-10-CM

## 2019-03-11 ENCOUNTER — Other Ambulatory Visit: Payer: Self-pay | Admitting: Sports Medicine

## 2019-03-11 DIAGNOSIS — E785 Hyperlipidemia, unspecified: Secondary | ICD-10-CM

## 2019-04-07 ENCOUNTER — Encounter: Payer: Self-pay | Admitting: Sports Medicine

## 2019-04-15 ENCOUNTER — Telehealth: Payer: Self-pay | Admitting: Sports Medicine

## 2019-04-15 NOTE — Telephone Encounter (Signed)
Noted  

## 2019-04-15 NOTE — Telephone Encounter (Signed)
Nathan Sampson called this morning about a message he left a few days ago. I scheduled him for Thursday morning. 04/17/2019

## 2019-04-17 ENCOUNTER — Ambulatory Visit (INDEPENDENT_AMBULATORY_CARE_PROVIDER_SITE_OTHER): Payer: BC Managed Care – PPO | Admitting: Sports Medicine

## 2019-04-17 ENCOUNTER — Encounter: Payer: Self-pay | Admitting: Sports Medicine

## 2019-04-17 ENCOUNTER — Ambulatory Visit (INDEPENDENT_AMBULATORY_CARE_PROVIDER_SITE_OTHER): Payer: BC Managed Care – PPO

## 2019-04-17 ENCOUNTER — Other Ambulatory Visit: Payer: Self-pay

## 2019-04-17 VITALS — BP 129/74 | HR 67 | Ht 70.0 in | Wt 229.0 lb

## 2019-04-17 DIAGNOSIS — M7732 Calcaneal spur, left foot: Secondary | ICD-10-CM

## 2019-04-17 DIAGNOSIS — M19072 Primary osteoarthritis, left ankle and foot: Secondary | ICD-10-CM | POA: Diagnosis not present

## 2019-04-17 DIAGNOSIS — M19071 Primary osteoarthritis, right ankle and foot: Secondary | ICD-10-CM | POA: Diagnosis not present

## 2019-04-17 DIAGNOSIS — N529 Male erectile dysfunction, unspecified: Secondary | ICD-10-CM

## 2019-04-17 DIAGNOSIS — Z23 Encounter for immunization: Secondary | ICD-10-CM

## 2019-04-17 DIAGNOSIS — E785 Hyperlipidemia, unspecified: Secondary | ICD-10-CM

## 2019-04-17 DIAGNOSIS — M79671 Pain in right foot: Secondary | ICD-10-CM

## 2019-04-17 DIAGNOSIS — I2510 Atherosclerotic heart disease of native coronary artery without angina pectoris: Secondary | ICD-10-CM | POA: Diagnosis not present

## 2019-04-17 DIAGNOSIS — M7731 Calcaneal spur, right foot: Secondary | ICD-10-CM

## 2019-04-17 DIAGNOSIS — M79672 Pain in left foot: Secondary | ICD-10-CM | POA: Diagnosis not present

## 2019-04-17 DIAGNOSIS — F5101 Primary insomnia: Secondary | ICD-10-CM

## 2019-04-17 DIAGNOSIS — S99922A Unspecified injury of left foot, initial encounter: Secondary | ICD-10-CM | POA: Diagnosis not present

## 2019-04-17 DIAGNOSIS — M1000 Idiopathic gout, unspecified site: Secondary | ICD-10-CM

## 2019-04-17 MED ORDER — ALLOPURINOL 100 MG PO TABS: 100.0000 mg | ORAL_TABLET | Freq: Every day | ORAL | 0 refills | Status: DC

## 2019-04-17 MED ORDER — SILDENAFIL CITRATE 100 MG PO TABS: 100.0000 mg | ORAL_TABLET | ORAL | 3 refills | Status: AC | PRN

## 2019-04-17 MED ORDER — ATORVASTATIN CALCIUM 40 MG PO TABS: 40.0000 mg | ORAL_TABLET | Freq: Every day | ORAL | 0 refills | Status: DC

## 2019-04-17 MED ORDER — TADALAFIL 10 MG PO TABS: 10.0000 mg | ORAL_TABLET | Freq: Every day | ORAL | 3 refills | Status: DC | PRN

## 2019-04-17 MED ORDER — INDOMETHACIN 50 MG PO CAPS: 50.0000 mg | ORAL_CAPSULE | Freq: Two times a day (BID) | ORAL | 0 refills | Status: DC

## 2019-04-17 MED ORDER — TRAZODONE HCL 50 MG PO TABS: 50.0000 mg | ORAL_TABLET | Freq: Every day | ORAL | 0 refills | Status: DC

## 2019-04-17 MED ORDER — NEBIVOLOL HCL 5 MG PO TABS: 5.0000 mg | ORAL_TABLET | Freq: Every day | ORAL | 0 refills | Status: DC

## 2019-04-17 NOTE — Assessment & Plan Note (Signed)
Switching back to atorvastatin

## 2019-04-17 NOTE — Assessment & Plan Note (Signed)
Adding large supplies of Cialis and Viagra, he is moving to the [facility].

## 2019-04-17 NOTE — Progress Notes (Signed)
Subjective:    CC: Multiple issues  HPI: Erectile dysfunction: New girlfriend has high libido, he desires to go up on his dose of Cialis and would like some Viagra to use intermittently.  Hyperlipidemia: Needs to switch back to atorvastatin, he is switching to Medicare and they will not cover his Livalo.  Foot pain: Bilateral but right worse than left, mostly located at the proximal fifth metatarsal shaft, worse with weightbearing.  I reviewed the past medical history, family history, social history, surgical history, and allergies today and no changes were needed.  Please see the problem list section below in epic for further details.  Past Medical History: Past Medical History:  Diagnosis Date  . Arthritis   . Coronary artery disease    Status post stenting of the mid LAD and distal right coronary with drug-eluting stents in July 2009.  He is Asymptomatic. He had a strss echo in September 2009 which was normal.  . Dyslipidemia   . Hypertension    Poorly Controlled. Exacerbated by sedentary lifestyle, diet and weight gain, and stress.  . OCD (obsessive compulsive disorder)    Past Surgical History: Past Surgical History:  Procedure Laterality Date  . ARTHROSCOPIC REPAIR ACL    . CARDIAC CATHETERIZATION     2 stents placed in 11/2007  . EYE SURGERY     lasik 16 yrs ago  . HERNIA REPAIR     AGE 60 - HERNIA REPAIR  . left rotator cuff     2013  . SURGERY FOR SLEEP APNEA     ABOUT 10 YRS AGO-? PALATOPLASTY AND T/A-SURGERY WAS DONE IN [place]  . TONSILLECTOMY     UPP  . TOTAL HIP ARTHROPLASTY Right 06/16/2015   Procedure: RIGHT TOTAL HIP ARTHROPLASTY ANTERIOR APPROACH;  Surgeon: Frederik Pear, MD;  Location: [facility];  Service: Orthopedics;  Laterality: Right;   Social History: Social History   Socioeconomic History  . Marital status: Divorced    Spouse name: Not on file  . Number of children: 1  . Years of education: Not on file  . Highest education level: Not on file   Occupational History  . Occupation: self employed    Employer: [employer]  . Financial resource strain: Not on file  . Food insecurity    Worry: Not on file    Inability: Not on file  . Transportation needs    Medical: Not on file    Non-medical: Not on file  Tobacco Use  . Smoking status: Former Smoker    Types: Cigars    Quit date: 06/05/2008    Years since quitting: 10.8  . Smokeless tobacco: Never Used  Substance and Sexual Activity  . Alcohol use: Yes    Alcohol/week: 2.0 standard drinks    Types: 2 Glasses of wine per week    Comment: occasionally  . Drug use: No  . Sexual activity: Not on file  Lifestyle  . Physical activity    Days per week: Not on file    Minutes per session: Not on file  . Stress: Not on file  Relationships  . Social Herbalist on phone: Not on file    Gets together: Not on file    Attends religious service: Not on file    Active member of club or organization: Not on file    Attends meetings of clubs or organizations: Not on file    Relationship status: Not on file  Other  Topics Concern  . Not on file  Social History Narrative  . Not on file   Family History: Family History  Problem Relation Age of Onset  . Diabetes Father    Allergies: Allergies  Allergen Reactions  . Penicillins Anaphylaxis    Has patient had a PCN reaction causing immediate rash, facial/tongue/throat swelling, SOB or lightheadedness with hypotension: Yes Has patient had a PCN reaction causing severe rash involving mucus membranes or skin necrosis: Yes Has patient had a PCN reaction that required hospitalization No Has patient had a PCN reaction occurring within the last 10 years: Yes If all of the above answers are "NO", then may proceed with Cephalosporin use.    Medications: See med rec.  Review of Systems: No fevers, chills, night sweats, weight loss, chest pain, or shortness of breath.   Objective:    General: Well  Developed, well nourished, and in no acute distress.  Neuro: Alert and oriented x3, extra-ocular muscles intact, sensation grossly intact.  HEENT: Normocephalic, atraumatic, pupils equal round reactive to light, neck supple, no masses, no lymphadenopathy, thyroid nonpalpable.  Skin: Warm and dry, no rashes. Cardiac: Regular rate and rhythm, no murmurs rubs or gallops, no lower extremity edema.  Respiratory: Clear to auscultation bilaterally. Not using accessory muscles, speaking in full sentences. Bilateral feet: No visible erythema or swelling. Range of motion is full in all directions. Strength is 5/5 in all directions. No hallux valgus. No pes cavus or pes planus. No abnormal callus noted. No pain over the navicular prominence, or base of fifth metatarsal. No tenderness to palpation of the calcaneal insertion of plantar fascia. No pain at the Achilles insertion. No pain over the calcaneal bursa. No pain of the retrocalcaneal bursa. Tender palpation over the right fifth metatarsal shaft proximally, lesser so on the left foot, there is moderate swelling bilaterally. No hallux rigidus or limitus. No tenderness palpation over interphalangeal joints. No pain with compression of the metatarsal heads. Neurovascularly intact distally.  Impression and Recommendations:    Erectile dysfunction Adding large supplies of Cialis and Viagra, he is moving to the [facility].  Right foot pain Pain for several months, proximal fifth metatarsal shaft. Question stress injury, x-rays, postop shoe, he will need to find a musculoskeletal provider in [place].  Gout Refilling allopurinol, adding indomethacin  Dyslipidemia Switching back to atorvastatin  I spent 40 minutes with this patient, greater than 50% was face-to-face time counseling regarding the above diagnoses  ___________________________________________ Gwen Her. Dianah Field, M.D., ABFM., CAQSM. Primary Care and Sports Medicine [facility]  Adjunct Professor of [place] [employer] [facility] [employer]

## 2019-04-17 NOTE — Assessment & Plan Note (Signed)
Pain for several months, proximal fifth metatarsal shaft. Question stress injury, x-rays, postop shoe, he will need to find a musculoskeletal provider in [place].

## 2019-04-17 NOTE — Assessment & Plan Note (Signed)
Refilling allopurinol, adding indomethacin

## 2019-04-18 ENCOUNTER — Encounter: Payer: Self-pay | Admitting: Sports Medicine

## 2019-05-27 DIAGNOSIS — J310 Chronic rhinitis: Secondary | ICD-10-CM | POA: Diagnosis not present

## 2019-07-14 ENCOUNTER — Other Ambulatory Visit: Payer: Self-pay | Admitting: Sports Medicine

## 2019-07-14 DIAGNOSIS — E785 Hyperlipidemia, unspecified: Secondary | ICD-10-CM

## 2019-07-14 DIAGNOSIS — M1000 Idiopathic gout, unspecified site: Secondary | ICD-10-CM

## 2019-07-14 DIAGNOSIS — F5101 Primary insomnia: Secondary | ICD-10-CM

## 2020-03-11 ENCOUNTER — Other Ambulatory Visit: Payer: Self-pay | Admitting: Sports Medicine

## 2020-03-11 DIAGNOSIS — I2510 Atherosclerotic heart disease of native coronary artery without angina pectoris: Secondary | ICD-10-CM

## 2020-03-22 ENCOUNTER — Other Ambulatory Visit: Payer: Self-pay | Admitting: Sports Medicine

## 2020-03-22 DIAGNOSIS — I2510 Atherosclerotic heart disease of native coronary artery without angina pectoris: Secondary | ICD-10-CM

## 2020-04-22 ENCOUNTER — Other Ambulatory Visit: Payer: Self-pay | Admitting: Sports Medicine

## 2020-04-22 DIAGNOSIS — I2510 Atherosclerotic heart disease of native coronary artery without angina pectoris: Secondary | ICD-10-CM

## 2020-06-27 ENCOUNTER — Other Ambulatory Visit: Payer: Self-pay | Admitting: Sports Medicine

## 2020-06-27 DIAGNOSIS — N529 Male erectile dysfunction, unspecified: Secondary | ICD-10-CM

## 2020-06-27 DIAGNOSIS — M1000 Idiopathic gout, unspecified site: Secondary | ICD-10-CM

## 2020-08-26 ENCOUNTER — Other Ambulatory Visit: Payer: Self-pay | Admitting: Sports Medicine

## 2020-08-26 DIAGNOSIS — M1000 Idiopathic gout, unspecified site: Secondary | ICD-10-CM

## 2020-10-30 ENCOUNTER — Other Ambulatory Visit: Payer: Self-pay | Admitting: Sports Medicine

## 2020-10-30 DIAGNOSIS — F5101 Primary insomnia: Secondary | ICD-10-CM

## 2020-10-30 DIAGNOSIS — N529 Male erectile dysfunction, unspecified: Secondary | ICD-10-CM
